# Patient Record
Sex: Male | Born: 1960 | Race: Black or African American | Hispanic: No | Marital: Single | Smoking: Never smoker
Health system: Southern US, Community
[De-identification: ages and names within clinical notes are randomized; demographics above are authoritative.]

## PROBLEM LIST (undated history)

## (undated) DIAGNOSIS — I10 Essential (primary) hypertension: Secondary | ICD-10-CM

## (undated) DIAGNOSIS — E039 Hypothyroidism, unspecified: Secondary | ICD-10-CM

## (undated) DIAGNOSIS — M1990 Unspecified osteoarthritis, unspecified site: Secondary | ICD-10-CM

## (undated) DIAGNOSIS — E119 Type 2 diabetes mellitus without complications: Secondary | ICD-10-CM

## (undated) DIAGNOSIS — M62838 Other muscle spasm: Secondary | ICD-10-CM

## (undated) DIAGNOSIS — E669 Obesity, unspecified: Secondary | ICD-10-CM

## (undated) HISTORY — PX: KNEE SURGERY: SHX244

---

## 2010-10-14 LAB — HM COLONOSCOPY

## 2010-11-01 ENCOUNTER — Ambulatory Visit: Payer: Self-pay | Admitting: Family Medicine

## 2013-12-12 ENCOUNTER — Ambulatory Visit: Payer: Self-pay | Admitting: Gastroenterology

## 2013-12-12 LAB — HM COLONOSCOPY

## 2014-08-22 ENCOUNTER — Other Ambulatory Visit: Payer: Self-pay | Admitting: Family Medicine

## 2014-08-28 ENCOUNTER — Other Ambulatory Visit: Payer: Self-pay | Admitting: Family Medicine

## 2014-08-28 NOTE — Telephone Encounter (Signed)
When was patient last seen and labs done. Thanks.

## 2014-08-28 NOTE — Telephone Encounter (Signed)
Dennis patient 

## 2014-08-29 NOTE — Telephone Encounter (Signed)
Francis Gomez patient 

## 2014-09-25 ENCOUNTER — Telehealth: Payer: Self-pay | Admitting: Family Medicine

## 2014-09-25 NOTE — Telephone Encounter (Signed)
Continues to have arthritis and muscle pains progressing over the past year. 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 He is requesting referral to a rheumatologist. Has had little to no relief from several NSAID's (Relafen, Lodine, Naproxen, Mobic, Ibuprofen). Will schedule referral.

## 2014-09-26 NOTE — Telephone Encounter (Signed)
Information faxed to Dr Wally Kernodle.His office will contact pt °

## 2015-02-12 ENCOUNTER — Telehealth: Payer: Self-pay | Admitting: Family Medicine

## 2015-02-12 NOTE — Telephone Encounter (Signed)
Pt called stating that he has not heard from [facility] Rheumatology department.Notes refaxed.Their office will contact pt

## 2015-02-15 DIAGNOSIS — M222X2 Patellofemoral disorders, left knee: Secondary | ICD-10-CM | POA: Insufficient documentation

## 2015-02-15 DIAGNOSIS — M25562 Pain in left knee: Secondary | ICD-10-CM

## 2015-02-15 DIAGNOSIS — G8929 Other chronic pain: Secondary | ICD-10-CM | POA: Insufficient documentation

## 2015-02-15 DIAGNOSIS — M25561 Pain in right knee: Secondary | ICD-10-CM

## 2015-02-15 DIAGNOSIS — M1712 Unilateral primary osteoarthritis, left knee: Secondary | ICD-10-CM | POA: Insufficient documentation

## 2015-02-16 ENCOUNTER — Other Ambulatory Visit: Payer: Self-pay | Admitting: Internal Medicine

## 2015-02-16 DIAGNOSIS — M222X2 Patellofemoral disorders, left knee: Secondary | ICD-10-CM

## 2015-02-16 DIAGNOSIS — M25562 Pain in left knee: Secondary | ICD-10-CM

## 2015-03-09 ENCOUNTER — Ambulatory Visit
Admission: RE | Admit: 2015-03-09 | Discharge: 2015-03-09 | Disposition: A | Discharge status: Home / Self Care | Payer: PRIVATE HEALTH INSURANCE | Source: Ambulatory Visit | Attending: Internal Medicine | Admitting: Internal Medicine

## 2015-03-09 DIAGNOSIS — M222X2 Patellofemoral disorders, left knee: Secondary | ICD-10-CM

## 2015-03-09 DIAGNOSIS — M1712 Unilateral primary osteoarthritis, left knee: Secondary | ICD-10-CM | POA: Insufficient documentation

## 2015-03-09 DIAGNOSIS — M25562 Pain in left knee: Secondary | ICD-10-CM

## 2015-03-14 ENCOUNTER — Encounter: Payer: Self-pay | Admitting: Family Medicine

## 2015-03-28 ENCOUNTER — Other Ambulatory Visit: Payer: Self-pay | Admitting: Family Medicine

## 2015-03-30 ENCOUNTER — Other Ambulatory Visit: Payer: Self-pay | Admitting: Family Medicine

## 2015-05-14 DIAGNOSIS — I10 Essential (primary) hypertension: Secondary | ICD-10-CM | POA: Insufficient documentation

## 2015-05-14 DIAGNOSIS — R7303 Prediabetes: Secondary | ICD-10-CM | POA: Insufficient documentation

## 2015-05-26 ENCOUNTER — Other Ambulatory Visit: Payer: Self-pay | Admitting: Family Medicine

## 2015-05-30 ENCOUNTER — Other Ambulatory Visit: Payer: Self-pay | Admitting: Family Medicine

## 2015-08-02 ENCOUNTER — Other Ambulatory Visit: Payer: Self-pay | Admitting: Family Medicine

## 2015-08-02 DIAGNOSIS — I10 Essential (primary) hypertension: Secondary | ICD-10-CM

## 2015-08-02 MED ORDER — VERAPAMIL HCL 120 MG PO TABS: 240.0000 mg | ORAL_TABLET | Freq: Every day | ORAL | 3 refills | Status: DC

## 2015-08-17 ENCOUNTER — Ambulatory Visit (INDEPENDENT_AMBULATORY_CARE_PROVIDER_SITE_OTHER): Payer: PRIVATE HEALTH INSURANCE | Admitting: Family Medicine

## 2015-08-17 ENCOUNTER — Encounter: Payer: Self-pay | Admitting: Family Medicine

## 2015-08-17 VITALS — BP 110/70 | HR 76 | Temp 98.1°F | Resp 16 | Ht 70.0 in | Wt 223.0 lb

## 2015-08-17 DIAGNOSIS — L918 Other hypertrophic disorders of the skin: Secondary | ICD-10-CM | POA: Diagnosis not present

## 2015-08-17 NOTE — Progress Notes (Signed)
       Patient: Francis Gomez Male    DOB: 10/23/1960   55 y.o.   MRN: [id number] Visit Date: 08/17/2015  Today's Provider: Dortha Kernennis Chrismon, PA   Chief Complaint  Patient presents with  . Skin Problem   Subjective:    HPI Skin problems: Patient reports lesions for about 6 or 7 years. Patient reports lesions under right and left eye. Patient denies changes to lesions. Patient denies seeing dermatologist or any over the counter treatment.     Patient Active Problem List   Diagnosis Date Noted  . Diabetes mellitus type 2, uncomplicated (HCC) 05/14/2015  . Hypertension 05/14/2015  . Bilateral chronic knee pain 02/15/2015  . Osteoarthritis of left knee 02/15/2015  . Patellofemoral stress syndrome of left knee 02/15/2015   Past Surgical History:  Procedure Laterality Date  . KNEE SURGERY Left    x's 2   Family History  Problem Relation Age of Onset  . Diabetes Mother   . Heart disease Father 3943    MI  . Heart disease Sister    Allergies  Allergen Reactions  . Amlodipine Itching   Current Meds  Medication Sig  . atorvastatin (LIPITOR) 40 MG tablet TAKE 1 TABLET BY MOUTH EVERY DAY  . losartan-hydrochlorothiazide (HYZAAR) 50-12.5 MG tablet TAKE 1 TABLET BY MOUTH EVERY DAY  . verapamil (CALAN) 120 MG tablet Take 2 tablets (240 mg total) by mouth daily.    Review of Systems  Constitutional: Negative.   Skin:       lesions    Social History  Substance Use Topics  . Smoking status: Never Smoker  . Smokeless tobacco: Never Used  . Alcohol use No   Objective:   BP 110/70 (BP Location: Right Arm, Patient Position: Sitting, Cuff Size: Large)   Pulse 76   Temp 98.1 F (36.7 C) (Oral)   Resp 16   Ht 5\' 10"  (1.778 m)   Wt 223 lb (101.2 kg)   BMI 32.00 kg/m   Physical Exam  Constitutional: He is oriented to person, place, and time. He appears well-developed and well-nourished. No distress.  HENT:  Head: Normocephalic and atraumatic.  Right Ear: Hearing normal.    Left Ear: Hearing normal.  Nose: Nose normal.  Eyes: Conjunctivae and lids are normal. Right eye exhibits no discharge. Left eye exhibits no discharge. No scleral icterus.  Pulmonary/Chest: Effort normal. No respiratory distress.  Musculoskeletal: Normal range of motion.  Neurological: He is alert and oriented to person, place, and time.  Skin: Skin is intact. No lesion and no rash noted.  Pedunculated lesion on the right upper eyelid and right upper cheek. Lesions are soft and measure 2 mm and 4 mm respectively.  Psychiatric: He has a normal mood and affect. His speech is normal and behavior is normal. Thought content normal.      Assessment & Plan:     1. Acrochordon Multiple hyperpigmented pedunculated lesions around eyes. Large one below the right eye and one on the upper eyelid becoming uncomfortable over the past month or two. Anesthetized lesions at the base with 1% Xylocaine with Epinephrine and removed/destroyed with Hyfrecator. No post procedure bleeding. May use Spot Band-Aid prn. Recheck prn.       Dortha Kernennis Chrismon, PA  [facility]

## 2015-10-16 LAB — HEMOGLOBIN A1C: Hemoglobin A1C: 6.2

## 2015-11-02 ENCOUNTER — Encounter: Payer: Self-pay | Admitting: Family Medicine

## 2015-12-23 ENCOUNTER — Other Ambulatory Visit: Payer: Self-pay | Admitting: Family Medicine

## 2015-12-24 ENCOUNTER — Other Ambulatory Visit: Payer: Self-pay | Admitting: Family Medicine

## 2015-12-24 DIAGNOSIS — E039 Hypothyroidism, unspecified: Secondary | ICD-10-CM

## 2015-12-24 MED ORDER — LEVOTHYROXINE SODIUM 25 MCG PO TABS: 25.0000 ug | ORAL_TABLET | Freq: Every day | ORAL | 3 refills | Status: DC

## 2015-12-28 ENCOUNTER — Other Ambulatory Visit: Payer: Self-pay | Admitting: Family Medicine

## 2016-02-28 ENCOUNTER — Other Ambulatory Visit: Payer: Self-pay | Admitting: Family Medicine

## 2016-02-28 DIAGNOSIS — M1712 Unilateral primary osteoarthritis, left knee: Secondary | ICD-10-CM

## 2016-02-28 DIAGNOSIS — M25562 Pain in left knee: Principal | ICD-10-CM

## 2016-02-28 DIAGNOSIS — G8929 Other chronic pain: Secondary | ICD-10-CM

## 2016-02-28 DIAGNOSIS — M25561 Pain in right knee: Principal | ICD-10-CM

## 2016-02-28 MED ORDER — PREDNISONE 10 MG PO TABS: 10.0000 mg | ORAL_TABLET | Freq: Every day | ORAL | 0 refills | Status: DC

## 2016-03-25 ENCOUNTER — Other Ambulatory Visit: Payer: Self-pay | Admitting: Family Medicine

## 2016-04-07 ENCOUNTER — Other Ambulatory Visit: Payer: Self-pay | Admitting: Family Medicine

## 2016-04-22 ENCOUNTER — Other Ambulatory Visit: Payer: Self-pay | Admitting: Family Medicine

## 2016-04-22 NOTE — Telephone Encounter (Signed)
Thyroid medication refilled. Remind patient he is due for recheck of labs regarding thyroid and high uric acid levels.

## 2016-04-22 NOTE — Telephone Encounter (Signed)
Patient advised. Patient states he will see Maurine Minister on Thursday at [place].

## 2016-04-24 ENCOUNTER — Other Ambulatory Visit: Payer: Self-pay

## 2016-04-24 ENCOUNTER — Other Ambulatory Visit: Payer: Self-pay | Admitting: Family Medicine

## 2016-04-24 DIAGNOSIS — E790 Hyperuricemia without signs of inflammatory arthritis and tophaceous disease: Secondary | ICD-10-CM

## 2016-04-24 DIAGNOSIS — M25561 Pain in right knee: Principal | ICD-10-CM

## 2016-04-24 DIAGNOSIS — M25562 Pain in left knee: Principal | ICD-10-CM

## 2016-04-24 DIAGNOSIS — R7989 Other specified abnormal findings of blood chemistry: Secondary | ICD-10-CM

## 2016-04-24 DIAGNOSIS — G8929 Other chronic pain: Secondary | ICD-10-CM

## 2016-04-24 MED ORDER — ETODOLAC 200 MG PO CAPS: 200.0000 mg | ORAL_CAPSULE | Freq: Three times a day (TID) | ORAL | 3 refills | Status: DC

## 2016-04-26 LAB — THYROID PANEL
Free Thyroxine Index: 1.8 (ref 1.2–4.9)
T3 Uptake Ratio: 25 % (ref 24–39)
T4, Total: 7.2 ug/dL (ref 4.5–12.0)

## 2016-04-26 LAB — URIC ACID: Uric Acid: 8.2 mg/dL (ref 3.7–8.6)

## 2016-06-09 DIAGNOSIS — M545 Low back pain, unspecified: Secondary | ICD-10-CM | POA: Insufficient documentation

## 2016-06-09 DIAGNOSIS — M79604 Pain in right leg: Secondary | ICD-10-CM | POA: Insufficient documentation

## 2016-06-09 DIAGNOSIS — M79605 Pain in left leg: Secondary | ICD-10-CM | POA: Insufficient documentation

## 2016-06-09 DIAGNOSIS — M5440 Lumbago with sciatica, unspecified side: Secondary | ICD-10-CM | POA: Insufficient documentation

## 2016-07-21 ENCOUNTER — Other Ambulatory Visit: Payer: Self-pay | Admitting: Family Medicine

## 2016-07-21 DIAGNOSIS — I10 Essential (primary) hypertension: Secondary | ICD-10-CM

## 2016-08-24 ENCOUNTER — Other Ambulatory Visit: Payer: Self-pay | Admitting: Family Medicine

## 2016-08-27 ENCOUNTER — Other Ambulatory Visit: Payer: Self-pay | Admitting: Family Medicine

## 2016-08-27 NOTE — Telephone Encounter (Signed)
Francis Gomez's patient. Last OV 08/17/15 Last RF 04/07/16

## 2016-08-29 ENCOUNTER — Other Ambulatory Visit: Payer: Self-pay | Admitting: Family Medicine

## 2016-08-30 ENCOUNTER — Other Ambulatory Visit: Payer: Self-pay | Admitting: Family Medicine

## 2016-09-23 ENCOUNTER — Other Ambulatory Visit: Payer: Self-pay | Admitting: Family Medicine

## 2016-12-17 ENCOUNTER — Other Ambulatory Visit: Payer: Self-pay | Admitting: Family Medicine

## 2017-01-20 ENCOUNTER — Encounter: Payer: Self-pay | Admitting: Family Medicine

## 2017-01-20 ENCOUNTER — Ambulatory Visit (INDEPENDENT_AMBULATORY_CARE_PROVIDER_SITE_OTHER): Payer: Self-pay | Admitting: Family Medicine

## 2017-01-20 VITALS — BP 148/88 | HR 91 | Temp 97.5°F | Ht 70.0 in | Wt 231.4 lb

## 2017-01-20 DIAGNOSIS — Z1159 Encounter for screening for other viral diseases: Secondary | ICD-10-CM

## 2017-01-20 DIAGNOSIS — E119 Type 2 diabetes mellitus without complications: Secondary | ICD-10-CM

## 2017-01-20 DIAGNOSIS — E039 Hypothyroidism, unspecified: Secondary | ICD-10-CM

## 2017-01-20 DIAGNOSIS — M159 Polyosteoarthritis, unspecified: Secondary | ICD-10-CM

## 2017-01-20 DIAGNOSIS — Z0000 Encounter for general adult medical examination without abnormal findings: Secondary | ICD-10-CM

## 2017-01-20 DIAGNOSIS — M2240 Chondromalacia patellae, unspecified knee: Secondary | ICD-10-CM | POA: Insufficient documentation

## 2017-01-20 DIAGNOSIS — Z125 Encounter for screening for malignant neoplasm of prostate: Secondary | ICD-10-CM

## 2017-01-20 DIAGNOSIS — I10 Essential (primary) hypertension: Secondary | ICD-10-CM

## 2017-01-20 DIAGNOSIS — Z114 Encounter for screening for human immunodeficiency virus [HIV]: Secondary | ICD-10-CM

## 2017-01-20 DIAGNOSIS — E782 Mixed hyperlipidemia: Secondary | ICD-10-CM

## 2017-01-20 LAB — POCT UA - MICROALBUMIN: Microalbumin Ur, POC: 20 mg/L

## 2017-01-20 NOTE — Progress Notes (Signed)
Patient: Francis Gomez, Male    DOB: [birth year], 57 y.o.   MRN: [id number] Visit Date: 01/20/2017  Today's Provider: Dortha Kern, PA   Chief Complaint  Patient presents with  . Annual Exam   Subjective:    Annual physical exam Francis Gomez is a 57 y.o. male who presents today for health maintenance and complete physical. He feels poorly due to arthritis.  He reports exercising none. He reports he is sleeping fairly well.  -----------------------------------------------------------------   Review of Systems  Constitutional: Negative.   HENT: Negative.   Eyes: Positive for redness.  Respiratory: Positive for cough.   Cardiovascular: Negative.   Gastrointestinal: Negative.   Endocrine: Negative.   Genitourinary: Negative.   Musculoskeletal: Positive for arthralgias and back pain.  Skin: Negative.   Allergic/Immunologic: Negative.   Neurological: Negative.   Hematological: Negative.   Psychiatric/Behavioral: Negative.     Social History      He  reports that  has never smoked. he has never used smokeless tobacco. He reports that he does not drink alcohol or use drugs.       Social History   Socioeconomic History  . Marital status: Single    Spouse name: None  . Number of children: None  . Years of education: None  . Highest education level: None  Social Needs  . Financial resource strain: None  . Food insecurity - worry: None  . Food insecurity - inability: None  . Transportation needs - medical: None  . Transportation needs - non-medical: None  Occupational History  . None  Tobacco Use  . Smoking status: Never Smoker  . Smokeless tobacco: Never Used  Substance and Sexual Activity  . Alcohol use: No  . Drug use: No  . Sexual activity: None  Other Topics Concern  . None  Social History Narrative  . None   Patient Active Problem List   Diagnosis Date Noted  . 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 Lumbago with sciatica 06/09/2016  .  Diabetes mellitus type 2, uncomplicated (HCC) 05/14/2015  . Hypertension 05/14/2015  . Bilateral chronic knee pain 02/15/2015  . Osteoarthritis of left knee 02/15/2015  . Patellofemoral stress syndrome of left knee 02/15/2015   Past Surgical History:  Procedure Laterality Date  . KNEE SURGERY Left    x's 2   Family History        Family Status  Relation Name Status  . Mother  Alive  . Father  Deceased  . Sister  Alive  . Brother  Alive  . Sister  Alive  . Sister  Alive  . Brother  Alive        His family history includes Diabetes in his mother; Heart disease in his sister; Heart disease (age of onset: 61) in his father.     Allergies  Allergen Reactions  . Amlodipine Itching    Current Outpatient Medications:  .  atorvastatin (LIPITOR) 40 MG tablet, TAKE 1 TABLET BY MOUTH EVERY DAY, Disp: 30 tablet, Rfl: 5 .  etodolac (LODINE) 200 MG capsule, Take 1 capsule (200 mg total) by mouth every 8 (eight) hours., Disp: 40 capsule, Rfl: 3 .  levothyroxine (SYNTHROID, LEVOTHROID) 25 MCG tablet, TAKE 1 TABLET (25 MCG TOTAL) BY MOUTH DAILY-- BEFORE BREAKFAST., Disp: 30 tablet, Rfl: 3 .  losartan-hydrochlorothiazide (HYZAAR) 50-12.5 MG tablet, TAKE 1 TABLET BY MOUTH EVERY DAY, Disp: 90 tablet, Rfl: 1 .  verapamil (CALAN) 120 MG tablet, TAKE 2 TABLETS (240 MG TOTAL) BY MOUTH  DAILY, Disp: 180 tablet, Rfl: 3   Patient Care Team: Chrismon, Jodell Ciproennis E, PA as PCP - General (Family Medicine)      Objective:   Vitals: BP (!) 148/88 (BP Location: Right Arm, Patient Position: Sitting, Cuff Size: Normal)   Pulse 91   Temp (!) 97.5 F (36.4 C) (Oral)   Ht 5\' 10"  (1.778 m)   Wt 231 lb 6.4 oz (105 kg)   SpO2 98%   BMI 33.20 kg/m     Physical Exam  Constitutional: He is oriented to person, place, and time. He appears well-developed and well-nourished.  HENT:  Head: Normocephalic and atraumatic.  Right Ear: External ear normal.  Left Ear: External ear normal.  Nose: Nose normal.    Mouth/Throat: Oropharynx is clear and moist.  Eyes: Conjunctivae and EOM are normal. Pupils are equal, round, and reactive to light. Right eye exhibits no discharge.  Neck: Normal range of motion. Neck supple. No tracheal deviation present. No thyromegaly present.  Cardiovascular: Normal rate, regular rhythm, normal heart sounds and intact distal pulses.  No murmur heard. Pulmonary/Chest: Effort normal and breath sounds normal. No respiratory distress. He has no wheezes. He has no rales. He exhibits no tenderness.  Abdominal: Soft. He exhibits no distension and no mass. There is no tenderness. There is no rebound and no guarding.  Genitourinary: Rectum normal and prostate normal. Rectal exam shows guaiac negative stool.  Musculoskeletal: He exhibits tenderness. He exhibits no edema or deformity.  Tender to palpate around ankles and knees. Increased pain to test knee flexion to 120 degrees. No locking or crepitus. Some sharp pains to test collateral ligaments. No swelling or redness of joints. Good range of motion throughout.   Lymphadenopathy:    He has no cervical adenopathy.  Neurological: He is alert and oriented to person, place, and time. He has normal reflexes. No cranial nerve deficit. He exhibits normal muscle tone. Coordination normal.  Skin: Skin is warm and dry. No rash noted. No erythema.  Psychiatric: He has a normal mood and affect. His behavior is normal. Judgment and thought content normal.     Depression Screen PHQ 2/9 Scores 01/20/2017  PHQ - 2 Score 5  PHQ- 9 Score 15    Assessment & Plan:     Routine Health Maintenance and Physical Exam  Exercise Activities and Dietary recommendations Goals    Unable to exercise significantly due to arthritis limiting mobility. Recommend low fat diet.      Immunization History  Administered Date(s) Administered  . Influenza-Unspecified 10/06/2016    Health Maintenance  Topic Date Due  . Hepatitis C Screening  Jul 09, 1960   . PNEUMOCOCCAL POLYSACCHARIDE VACCINE (1) 07/26/1962  . FOOT EXAM  07/26/1970  . OPHTHALMOLOGY EXAM  07/26/1970  . HIV Screening  07/26/1975  . TETANUS/TDAP  07/26/1979  . COLONOSCOPY  07/26/2010  . HEMOGLOBIN A1C  04/15/2016  . INFLUENZA VACCINE  Completed     Discussed health benefits of physical activity, and encouraged him to engage in regular exercise appropriate for his age and condition.    -------------------------------------------------------------------- 1. Annual physical exam General health stable. Continues to have knee and ankle pains from arthritis that limits all activities. He is in the process of applying for disability. States he is unable to stand to work due to this pain issue.  2. Type 2 diabetes mellitus without complication, unspecified whether long term insulin use (HCC) hgb A1C was 6.2% on 10-16-15 - prediabetes level. Denies polyuria, polydipsia or polyphagia. No hypoglycemic  events. Will the labs for progression from prediabetes to full diabetes and other metabolic disorders. - Lipid Profile - HgB A1c - HIV antibody - POCT UA - Microalbumin - CBC with Differential/Platelet - Comprehensive metabolic panel  3. Essential hypertension Tolerating Verapamil 240 mg qd and Hyzaar 50-12.5 mg qd. Should get back on medication regularly and restrict caffeine, salt, ETOH, etc. Check routine labs and follow up pending report. - Lipid Profile - HIV antibody - CBC with Differential/Platelet - Comprehensive metabolic panel  4. Hypothyroidism, unspecified type Still taking Levothyroxine 25 mcg qd. Recheck labs. Denies edema, palpitations, sweats or tremors.  - CBC with Differential/Platelet - T4 - TSH  5. Need for hepatitis C screening test - Hepatitis C Antibody  6. Screening for HIV (human immunodeficiency virus) - HIV antibody  7. Prostate cancer screening - PSA  8. Mixed hyperlipidemia Tolerating Atorvastatin 40 mg qd. Must continue low fat diet and  increase in water intake. Recheck lipid panel with CMP. - Lipid Profile - Comprehensive metabolic panel  9. Osteoarthritis of multiple joints, unspecified osteoarthritis type Many years of arthralgias - left knee osteoarthritis, patellofemoral syndrome, chondromalacia and arthroscopic surgery in 2006 and 2007. He has failed multiple NSAID's, steroid injections, knee brace and cannot tolerate Oxycontin due to severe constipation. Has had orthopedic and rheumatology evaluations. Has had a fall at Christmas 2018 when the left knee "gave away". May continue Tylenol prn pain and/or apply Aspercreme with Lidocaine. Requests letter stating these facts in his attempt to apply for disability.    Dortha Kern, PA  [facility]

## 2017-01-21 LAB — CBC WITH DIFFERENTIAL/PLATELET
Basophils Absolute: 0 10*3/uL (ref 0.0–0.2)
Basos: 0 %
EOS (ABSOLUTE): 0.2 10*3/uL (ref 0.0–0.4)
EOS: 4 %
HEMATOCRIT: 45.4 % (ref 37.5–51.0)
HEMOGLOBIN: 15.7 g/dL (ref 13.0–17.7)
IMMATURE GRANULOCYTES: 0 %
Immature Grans (Abs): 0 10*3/uL (ref 0.0–0.1)
LYMPHS ABS: 2.1 10*3/uL (ref 0.7–3.1)
Lymphs: 45 %
MCH: 27.8 pg (ref 26.6–33.0)
MCHC: 34.6 g/dL (ref 31.5–35.7)
MCV: 80 fL (ref 79–97)
MONOCYTES: 5 %
Monocytes Absolute: 0.2 10*3/uL (ref 0.1–0.9)
NEUTROS PCT: 46 %
Neutrophils Absolute: 2.1 10*3/uL (ref 1.4–7.0)
Platelets: 239 10*3/uL (ref 150–379)
RBC: 5.65 x10E6/uL (ref 4.14–5.80)
RDW: 15 % (ref 12.3–15.4)
WBC: 4.7 10*3/uL (ref 3.4–10.8)

## 2017-01-21 LAB — COMPREHENSIVE METABOLIC PANEL
ALK PHOS: 131 IU/L — AB (ref 39–117)
ALT: 36 IU/L (ref 0–44)
AST: 24 IU/L (ref 0–40)
Albumin/Globulin Ratio: 1.3 (ref 1.2–2.2)
Albumin: 4.3 g/dL (ref 3.5–5.5)
BUN/Creatinine Ratio: 7 — ABNORMAL LOW (ref 9–20)
BUN: 8 mg/dL (ref 6–24)
Bilirubin Total: 0.4 mg/dL (ref 0.0–1.2)
CALCIUM: 9.7 mg/dL (ref 8.7–10.2)
CO2: 22 mmol/L (ref 20–29)
CREATININE: 1.21 mg/dL (ref 0.76–1.27)
Chloride: 103 mmol/L (ref 96–106)
GFR calc Af Amer: 77 mL/min/{1.73_m2} (ref >59)
GFR, EST NON AFRICAN AMERICAN: 67 mL/min/{1.73_m2} (ref >59)
GLUCOSE: 97 mg/dL (ref 65–99)
Globulin, Total: 3.4 g/dL (ref 1.5–4.5)
Potassium: 3.9 mmol/L (ref 3.5–5.2)
SODIUM: 141 mmol/L (ref 134–144)
Total Protein: 7.7 g/dL (ref 6.0–8.5)

## 2017-01-21 LAB — T4: T4 TOTAL: 7.3 ug/dL (ref 4.5–12.0)

## 2017-01-21 LAB — TSH: TSH: 2.85 u[IU]/mL (ref 0.450–4.500)

## 2017-01-21 LAB — LIPID PANEL
Chol/HDL Ratio: 5.3 ratio — ABNORMAL HIGH (ref 0.0–5.0)
Cholesterol, Total: 176 mg/dL (ref 100–199)
HDL: 33 mg/dL — ABNORMAL LOW (ref >39)
Triglycerides: 403 mg/dL — ABNORMAL HIGH (ref 0–149)

## 2017-01-21 LAB — HEPATITIS C ANTIBODY: Hep C Virus Ab: <0.1 s/co ratio (ref 0.0–0.9)

## 2017-01-21 LAB — HEMOGLOBIN A1C
Est. average glucose Bld gHb Est-mCnc: 137 mg/dL
HEMOGLOBIN A1C: 6.4 % — AB (ref 4.8–5.6)

## 2017-01-21 LAB — HIV ANTIBODY (ROUTINE TESTING W REFLEX): HIV Screen 4th Generation wRfx: NONREACTIVE

## 2017-01-21 LAB — PSA: PROSTATE SPECIFIC AG, SERUM: 0.7 ng/mL (ref 0.0–4.0)

## 2017-01-26 ENCOUNTER — Encounter: Payer: Self-pay | Admitting: Family Medicine

## 2017-01-27 ENCOUNTER — Other Ambulatory Visit: Payer: Self-pay | Admitting: Family Medicine

## 2017-01-27 DIAGNOSIS — M1712 Unilateral primary osteoarthritis, left knee: Secondary | ICD-10-CM

## 2017-01-27 DIAGNOSIS — M25562 Pain in left knee: Secondary | ICD-10-CM

## 2017-01-27 DIAGNOSIS — G8929 Other chronic pain: Secondary | ICD-10-CM

## 2017-01-27 NOTE — Progress Notes (Signed)
Advised 

## 2017-01-27 NOTE — Progress Notes (Signed)
Advised  ED 

## 2017-02-12 ENCOUNTER — Other Ambulatory Visit: Payer: Self-pay | Admitting: Physician Assistant

## 2017-03-24 ENCOUNTER — Other Ambulatory Visit: Payer: Self-pay | Admitting: Family Medicine

## 2017-03-24 DIAGNOSIS — I10 Essential (primary) hypertension: Secondary | ICD-10-CM

## 2017-03-24 MED ORDER — LOSARTAN POTASSIUM 50 MG PO TABS: 50.0000 mg | ORAL_TABLET | Freq: Every day | ORAL | 3 refills | Status: DC

## 2017-03-24 MED ORDER — HYDROCHLOROTHIAZIDE 12.5 MG PO TABS: 12.5000 mg | ORAL_TABLET | Freq: Every day | ORAL | 3 refills | Status: DC

## 2017-03-24 NOTE — Progress Notes (Signed)
Hyzaar on back order and not available. Pharmacy has Losartan and HCTZ in separate pills. Will send these two prescriptions to continue treatment of hypertension.

## 2017-04-15 ENCOUNTER — Other Ambulatory Visit: Payer: Self-pay | Admitting: Family Medicine

## 2017-05-26 ENCOUNTER — Other Ambulatory Visit: Payer: Self-pay | Admitting: Family Medicine

## 2017-05-26 DIAGNOSIS — I10 Essential (primary) hypertension: Secondary | ICD-10-CM

## 2017-06-23 ENCOUNTER — Other Ambulatory Visit: Payer: Self-pay | Admitting: Family Medicine

## 2017-07-01 ENCOUNTER — Other Ambulatory Visit: Payer: Self-pay | Admitting: Family Medicine

## 2017-07-01 DIAGNOSIS — I10 Essential (primary) hypertension: Secondary | ICD-10-CM

## 2017-07-01 NOTE — Telephone Encounter (Signed)
[facility] pharmacy faxed a refill request for the following medication. Thanks CC  losartan (COZAAR) 50 MG tablet

## 2017-07-02 MED ORDER — LOSARTAN POTASSIUM 50 MG PO TABS: 50.0000 mg | ORAL_TABLET | Freq: Every day | ORAL | 3 refills | Status: DC

## 2017-08-26 ENCOUNTER — Other Ambulatory Visit: Payer: Self-pay | Admitting: Family Medicine

## 2017-10-30 ENCOUNTER — Other Ambulatory Visit: Payer: Self-pay | Admitting: Family Medicine

## 2017-11-19 ENCOUNTER — Other Ambulatory Visit: Payer: Self-pay | Admitting: Family Medicine

## 2017-11-19 DIAGNOSIS — I10 Essential (primary) hypertension: Secondary | ICD-10-CM

## 2017-11-26 ENCOUNTER — Other Ambulatory Visit: Payer: Self-pay | Admitting: Family Medicine

## 2017-12-25 ENCOUNTER — Other Ambulatory Visit: Payer: Self-pay | Admitting: Family Medicine

## 2018-02-04 ENCOUNTER — Other Ambulatory Visit: Payer: Self-pay | Admitting: Family Medicine

## 2018-03-10 NOTE — Progress Notes (Deleted)
Patient: Francis Gomez, Male    DOB: [birth year], 58 y.o.   MRN: [id number] Visit Date: 03/10/2018  Today's Provider: Dortha Kern, PA   No chief complaint on file.  Subjective:     Annual physical exam Francis Gomez is a 58 y.o. male who presents today for health maintenance and complete physical. He feels {DESC; WELL/FAIRLY WELL/POORLY:18703}. He reports exercising ***. He reports he is sleeping {DESC; WELL/FAIRLY WELL/POORLY:18703}.  -----------------------------------------------------------------   Review of Systems  Social History      He  reports that he has never smoked. He has never used smokeless tobacco. He reports that he does not drink alcohol or use drugs.       Social History   Socioeconomic History  . Marital status: Single    Spouse name: Not on file  . Number of children: Not on file  . Years of education: Not on file  . Highest education level: Not on file  Occupational History  . Not on file  Social Needs  . Financial resource strain: Not on file  . Food insecurity:    Worry: Not on file    Inability: Not on file  . Transportation needs:    Medical: Not on file    Non-medical: Not on file  Tobacco Use  . Smoking status: Never Smoker  . Smokeless tobacco: Never Used  Substance and Sexual Activity  . Alcohol use: No  . Drug use: No  . Sexual activity: Not on file  Lifestyle  . Physical activity:    Days per week: Not on file    Minutes per session: Not on file  . Stress: Not on file  Relationships  . Social connections:    Talks on phone: Not on file    Gets together: Not on file    Attends religious service: Not on file    Active member of club or organization: Not on file    Attends meetings of clubs or organizations: Not on file    Relationship status: Not on file  Other Topics Concern  . Not on file  Social History Narrative  . Not on file    No past medical history on file.   Patient Active Problem List   Diagnosis Date Noted  . 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 Hypothyroidism 01/20/2017  . Lumbago with sciatica 06/09/2016  . Diabetes mellitus type 2, uncomplicated (HCC) 05/14/2015  . Hypertension 05/14/2015  . Bilateral chronic knee pain 02/15/2015  . Osteoarthritis of left knee 02/15/2015  . Patellofemoral stress syndrome of left knee 02/15/2015    Past Surgical History:  Procedure Laterality Date  . KNEE SURGERY Left    x's 2    Family History        Family Status  Relation Name Status  . Mother  Alive  . Father  Deceased  . Sister  Alive  . Brother  Alive  . Sister  Alive  . Sister  Alive  . Brother  Alive        His family history includes Diabetes in his mother; Heart disease in his sister; Heart disease (age of onset: 85) in his father.      Allergies  Allergen Reactions  . Amlodipine Itching     Current Outpatient Medications:  .  atorvastatin (LIPITOR) 40 MG tablet, TAKE 1 TABLET BY MOUTH EVERY DAY, Disp: 30 tablet, Rfl: 1 .  etodolac (LODINE) 200 MG capsule, Take 1 capsule (200 mg total) by mouth every  8 (eight) hours., Disp: 40 capsule, Rfl: 3 .  hydrochlorothiazide (HYDRODIURIL) 12.5 MG tablet, Take 1 tablet (12.5 mg total) by mouth daily., Disp: 90 tablet, Rfl: 3 .  levothyroxine (SYNTHROID, LEVOTHROID) 25 MCG tablet, TAKE 1 TABLET (25 MCG TOTAL) BY MOUTH DAILY-- BEFORE BREAKFAST., Disp: 90 tablet, Rfl: 3 .  losartan (COZAAR) 50 MG tablet, Take 1 tablet (50 mg total) by mouth daily., Disp: 90 tablet, Rfl: 3 .  verapamil (CALAN) 120 MG tablet, TAKE 2 TABLETS BY MOUTH EVERY DAY, Disp: 180 tablet, Rfl: 1   Patient Care Team: Chrismon, Jodell Cipro, PA as PCP - General (Family Medicine)    Objective:    Vitals: There were no vitals taken for this visit.  There were no vitals filed for this visit.   Physical Exam   Depression Screen PHQ 2/9 Scores 01/20/2017  PHQ - 2 Score 5  PHQ- 9 Score 15       Assessment & Plan:     Routine Health  Maintenance and Physical Exam  Exercise Activities and Dietary recommendations Goals   None     Immunization History  Administered Date(s) Administered  . Influenza-Unspecified 10/06/2016    Health Maintenance  Topic Date Due  . PNEUMOCOCCAL POLYSACCHARIDE VACCINE AGE 49-64 HIGH RISK  07/26/1962  . FOOT EXAM  07/26/1970  . OPHTHALMOLOGY EXAM  07/26/1970  . TETANUS/TDAP  07/26/1979  . HEMOGLOBIN A1C  07/20/2017  . INFLUENZA VACCINE  08/06/2017  . COLONOSCOPY  12/13/2018  . Hepatitis C Screening  Completed  . HIV Screening  Completed     Discussed health benefits of physical activity, and encouraged him to engage in regular exercise appropriate for his age and condition.    --------------------------------------------------------------------    Dortha Kern, PA  [facility]

## 2018-03-11 ENCOUNTER — Encounter: Payer: Self-pay | Admitting: Family Medicine

## 2018-03-11 ENCOUNTER — Other Ambulatory Visit: Payer: Self-pay | Admitting: Family Medicine

## 2018-03-23 NOTE — Progress Notes (Signed)
Patient: Francis Gomez, Male    DOB: [birth year], 58 y.o.   MRN: [id number] Visit Date: 03/26/2018  Today's Provider: Dortha Kernennis Missy Baksh, PA   Chief Complaint  Patient presents with  . Annual Exam   Subjective:     Annual physical exam Francis Gomez is a 58 y.o. male who presents today for health maintenance and complete physical. He feels fairly well. He reports exercising some. He reports he is sleeping fairly well.  -----------------------------------------------------------------   Review of Systems  Eyes: Positive for itching.  Musculoskeletal: Positive for arthralgias.  All other systems reviewed and are negative.  Social History      He  reports that he has never smoked. He has never used smokeless tobacco. He reports that he does not drink alcohol or use drugs.       Social History   Socioeconomic History  . Marital status: Single    Spouse name: Not on file  . Number of children: Not on file  . Years of education: Not on file  . Highest education level: Not on file  Occupational History  . Not on file  Social Needs  . Financial resource strain: Not on file  . Food insecurity:    Worry: Not on file    Inability: Not on file  . Transportation needs:    Medical: Not on file    Non-medical: Not on file  Tobacco Use  . Smoking status: Never Smoker  . Smokeless tobacco: Never Used  Substance and Sexual Activity  . Alcohol use: No  . Drug use: No  . Sexual activity: Not on file  Lifestyle  . Physical activity:    Days per week: Not on file    Minutes per session: Not on file  . Stress: Not on file  Relationships  . Social connections:    Talks on phone: Not on file    Gets together: Not on file    Attends religious service: Not on file    Active member of club or organization: Not on file    Attends meetings of clubs or organizations: Not on file    Relationship status: Not on file  Other Topics Concern  . Not on file  Social History  Narrative  . Not on file   History reviewed. No pertinent past medical history.  Patient Active Problem List   Diagnosis Date Noted  . 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 Hypothyroidism 01/20/2017  . Lumbago with sciatica 06/09/2016  . Prediabetes 05/14/2015  . Hypertension 05/14/2015  . Bilateral chronic knee pain 02/15/2015  . Osteoarthritis of left knee 02/15/2015  . Patellofemoral stress syndrome of left knee 02/15/2015   Past Surgical History:  Procedure Laterality Date  . KNEE SURGERY Left    x's 2   Family History        Family Status  Relation Name Status  . Mother  Alive  . Father  Deceased  . Sister  Alive  . Brother  Alive  . Sister  Alive  . Sister  Alive  . Brother  Alive        His family history includes Diabetes in his mother; Heart disease in his sister; Heart disease (age of onset: 7443) in his father.     Allergies  Allergen Reactions  . Amlodipine Itching    Current Outpatient Medications:  .  atorvastatin (LIPITOR) 40 MG tablet, TAKE 1 TABLET BY MOUTH EVERY DAY, Disp: 30 tablet, Rfl: 1 .  levothyroxine (  SYNTHROID, LEVOTHROID) 25 MCG tablet, TAKE 1 TABLET (25 MCG TOTAL) BY MOUTH DAILY-- BEFORE BREAKFAST., Disp: 90 tablet, Rfl: 3 .  losartan (COZAAR) 50 MG tablet, Take 1 tablet (50 mg total) by mouth daily., Disp: 90 tablet, Rfl: 3 .  verapamil (CALAN) 120 MG tablet, TAKE 2 TABLETS BY MOUTH EVERY DAY, Disp: 180 tablet, Rfl: 1 .  etodolac (LODINE) 200 MG capsule, Take 1 capsule (200 mg total) by mouth every 8 (eight) hours. (Patient not taking: Reported on 03/26/2018), Disp: 40 capsule, Rfl: 3 .  hydrochlorothiazide (HYDRODIURIL) 12.5 MG tablet, Take 1 tablet (12.5 mg total) by mouth daily. (Patient not taking: Reported on 03/26/2018), Disp: 90 tablet, Rfl: 3   Patient Care Team: Valynn Schamberger, Jodell Cipro, PA as PCP - General (Family Medicine)   Objective:    Vitals: BP 110/70 (BP Location: Right Arm, Patient Position: Sitting, Cuff Size: Large)   Pulse  88   Temp 98 F (36.7 C) (Oral)   Resp 16   Ht 5\' 10"  (1.778 m)   Wt 230 lb (104.3 kg)   SpO2 94%   BMI 33.00 kg/m    Wt Readings from Last 3 Encounters:  03/26/18 230 lb (104.3 kg)  01/20/17 231 lb 6.4 oz (105 kg)  08/17/15 223 lb (101.2 kg)   Vitals:   03/26/18 1016  BP: 110/70  Pulse: 88  Resp: 16  Temp: 98 F (36.7 C)  TempSrc: Oral  SpO2: 94%  Weight: 230 lb (104.3 kg)  Height: 5\' 10"  (1.778 m)    Physical Exam Constitutional:      Appearance: He is well-developed.  HENT:     Head: Normocephalic and atraumatic.     Right Ear: External ear normal.     Left Ear: External ear normal.     Nose: Nose normal.  Eyes:     General:        Right eye: No discharge.     Conjunctiva/sclera: Conjunctivae normal.     Pupils: Pupils are equal, round, and reactive to light.  Neck:     Musculoskeletal: Normal range of motion and neck supple.     Thyroid: No thyromegaly.     Trachea: No tracheal deviation.  Cardiovascular:     Rate and Rhythm: Normal rate and regular rhythm.     Heart sounds: Normal heart sounds. No murmur.  Pulmonary:     Effort: Pulmonary effort is normal. No respiratory distress.     Breath sounds: Normal breath sounds. No wheezing or rales.  Chest:     Chest wall: No tenderness.  Abdominal:     General: There is no distension.     Palpations: Abdomen is soft. There is no mass.     Tenderness: There is no abdominal tenderness. There is no guarding or rebound.  Genitourinary:    Penis: Normal.      Scrotum/Testes: Normal.     Prostate: Normal.     Rectum: Normal. Guaiac result negative.  Musculoskeletal: Normal range of motion.        General: No tenderness.  Lymphadenopathy:     Cervical: No cervical adenopathy.  Skin:    General: Skin is warm and dry.     Findings: No erythema or rash.  Neurological:     Mental Status: He is alert and oriented to person, place, and time.     Cranial Nerves: No cranial nerve deficit.     Motor: No abnormal  muscle tone.     Coordination: Coordination normal.  Deep Tendon Reflexes: Reflexes are normal and symmetric. Reflexes normal.  Psychiatric:        Behavior: Behavior normal.        Thought Content: Thought content normal.        Judgment: Judgment normal.      Depression Screen PHQ 2/9 Scores 03/26/2018 01/20/2017  PHQ - 2 Score 3 5  PHQ- 9 Score 6 15     Assessment & Plan:     Routine Health Maintenance and Physical Exam  Exercise Activities and Dietary recommendations Goals   Encouraged 6 eight ounce glasses of water daily with low fat diet and should exercise (walk) 30-40 minutes 3-4 days a week.    Immunization History  Administered Date(s) Administered  . Influenza-Unspecified 10/06/2016   Health Maintenance  Topic Date Due  . PNEUMOCOCCAL POLYSACCHARIDE VACCINE AGE 49-64 HIGH RISK  07/26/1962  . FOOT EXAM  07/26/1970  . OPHTHALMOLOGY EXAM  07/26/1970  . TETANUS/TDAP  07/26/1979  . HEMOGLOBIN A1C  07/20/2017  . INFLUENZA VACCINE  08/06/2017  . COLONOSCOPY  12/13/2018  . Hepatitis C Screening  Completed  . HIV Screening  Completed    Discussed health benefits of physical activity, and encouraged him to engage in regular exercise appropriate for his age and condition.    -------------------------------------------------------------------- 1. Prediabetes Hgb A1C was 6.4% last year and weight is nearly the same with BMI over 33. No polyuria, polydipsia or polyphagia. Denies vision changes. Will recheck labs for evidence of progression to full diabetes. Microalbumin level same as last year at 20 mg/L and still taking ARB. Should continue low fat diet and encouraged exercise to reduce weight for sugar control. General exam normal and up dated immunizations. Last colonoscopy normal in 2015. - POCT UA - Microalbumin - CBC with Differential/Platelet - Comprehensive metabolic panel - Lipid panel - Hemoglobin A1c  2. Essential hypertension Well controlled. Continues  to take Losartan 50 mg qd, Verapamil 120 mg 2 tablets daily and HCTZ 12.5 mg qd. Recheck routine labs and follow up pending reports. - CBC with Differential/Platelet - Comprehensive metabolic panel - TSH  3. Hypothyroidism, unspecified type Tolerating Levothyroxine 25 mcg qd. Questionable exophthalmos but no tremor, palpitations or heat/cold intolerance. Will recheck labs and consider need for dosage adjustment with BMI over 33. - CBC with Differential/Platelet - TSH - T4  4. BMI 33.0-33.9,adult No significant weight gain in the past year but BMI still over 33. Recommend he reduce caloric intake and walk for exercise 30-40 minutes 3-4 days a week. Recheck routine labs to assess metabolic condition. - CBC with Differential/Platelet - Comprehensive metabolic panel - Lipid panel - Hemoglobin A1c - TSH - T4  5. Need for influenza vaccination - Flu Vaccine QUAD 36+ mos IM  6. Need for Tdap vaccination - Tdap vaccine greater than or equal to 7yo IM    Dortha Kern, PA  [facility]

## 2018-03-26 ENCOUNTER — Encounter: Payer: Self-pay | Admitting: Family Medicine

## 2018-03-26 ENCOUNTER — Other Ambulatory Visit: Payer: Self-pay

## 2018-03-26 ENCOUNTER — Ambulatory Visit: Payer: Self-pay | Admitting: Family Medicine

## 2018-03-26 VITALS — BP 110/70 | HR 88 | Temp 98.0°F | Resp 16 | Ht 70.0 in | Wt 230.0 lb

## 2018-03-26 DIAGNOSIS — Z23 Encounter for immunization: Secondary | ICD-10-CM

## 2018-03-26 DIAGNOSIS — R7303 Prediabetes: Secondary | ICD-10-CM

## 2018-03-26 DIAGNOSIS — Z6833 Body mass index (BMI) 33.0-33.9, adult: Secondary | ICD-10-CM

## 2018-03-26 DIAGNOSIS — I10 Essential (primary) hypertension: Secondary | ICD-10-CM

## 2018-03-26 DIAGNOSIS — E039 Hypothyroidism, unspecified: Secondary | ICD-10-CM

## 2018-03-26 LAB — POCT UA - MICROALBUMIN: Microalbumin Ur, POC: 20 mg/L

## 2018-03-26 MED ORDER — HYDROCHLOROTHIAZIDE 12.5 MG PO TABS: 12.5000 mg | ORAL_TABLET | Freq: Every day | ORAL | 3 refills | Status: DC

## 2018-03-27 LAB — HEMOGLOBIN A1C
Est. average glucose Bld gHb Est-mCnc: 137 mg/dL
Hgb A1c MFr Bld: 6.4 % — ABNORMAL HIGH (ref 4.8–5.6)

## 2018-03-27 LAB — COMPREHENSIVE METABOLIC PANEL
ALT: 46 IU/L — ABNORMAL HIGH (ref 0–44)
AST: 29 IU/L (ref 0–40)
Albumin/Globulin Ratio: 1.4 (ref 1.2–2.2)
Albumin: 4.5 g/dL (ref 3.8–4.9)
Alkaline Phosphatase: 160 IU/L — ABNORMAL HIGH (ref 39–117)
BILIRUBIN TOTAL: 0.5 mg/dL (ref 0.0–1.2)
BUN/Creatinine Ratio: 9 (ref 9–20)
BUN: 11 mg/dL (ref 6–24)
CO2: 22 mmol/L (ref 20–29)
Calcium: 9.3 mg/dL (ref 8.7–10.2)
Chloride: 102 mmol/L (ref 96–106)
Creatinine, Ser: 1.22 mg/dL (ref 0.76–1.27)
GFR calc Af Amer: 76 mL/min/{1.73_m2} (ref >59)
GFR calc non Af Amer: 65 mL/min/{1.73_m2} (ref >59)
Globulin, Total: 3.2 g/dL (ref 1.5–4.5)
Glucose: 80 mg/dL (ref 65–99)
Potassium: 4.2 mmol/L (ref 3.5–5.2)
Sodium: 142 mmol/L (ref 134–144)
Total Protein: 7.7 g/dL (ref 6.0–8.5)

## 2018-03-27 LAB — TSH: TSH: 2.89 u[IU]/mL (ref 0.450–4.500)

## 2018-03-27 LAB — CBC WITH DIFFERENTIAL/PLATELET
Basophils Absolute: 0 10*3/uL (ref 0.0–0.2)
Basos: 1 %
EOS (ABSOLUTE): 0.1 10*3/uL (ref 0.0–0.4)
Eos: 2 %
Hematocrit: 46.7 % (ref 37.5–51.0)
Hemoglobin: 15.6 g/dL (ref 13.0–17.7)
Immature Grans (Abs): 0 10*3/uL (ref 0.0–0.1)
Immature Granulocytes: 0 %
Lymphocytes Absolute: 2.3 10*3/uL (ref 0.7–3.1)
Lymphs: 43 %
MCH: 27.8 pg (ref 26.6–33.0)
MCHC: 33.4 g/dL (ref 31.5–35.7)
MCV: 83 fL (ref 79–97)
Monocytes Absolute: 0.3 10*3/uL (ref 0.1–0.9)
Monocytes: 6 %
Neutrophils Absolute: 2.6 10*3/uL (ref 1.4–7.0)
Neutrophils: 48 %
Platelets: 239 10*3/uL (ref 150–450)
RBC: 5.61 x10E6/uL (ref 4.14–5.80)
RDW: 14.9 % (ref 11.6–15.4)
WBC: 5.4 10*3/uL (ref 3.4–10.8)

## 2018-03-27 LAB — LIPID PANEL
Chol/HDL Ratio: 6.3 ratio — ABNORMAL HIGH (ref 0.0–5.0)
Cholesterol, Total: 202 mg/dL — ABNORMAL HIGH (ref 100–199)
HDL: 32 mg/dL — ABNORMAL LOW (ref >39)
Triglycerides: 468 mg/dL — ABNORMAL HIGH (ref 0–149)

## 2018-03-27 LAB — T4: T4, Total: 7.9 ug/dL (ref 4.5–12.0)

## 2018-03-28 ENCOUNTER — Other Ambulatory Visit: Payer: Self-pay | Admitting: Family Medicine

## 2018-03-29 ENCOUNTER — Telehealth: Payer: Self-pay

## 2018-03-29 NOTE — Telephone Encounter (Signed)
-----   Message from Jodell Cipro [place], [place] sent at 03/28/2018  7:00 PM EDT ----- Alkaline phosphatase elevated due to arthritis. Cholesterol and triglycerides worsening with increase in risk ratio from 5.3 to 6.3. Increase Atorvastatin to 80 mg qd and add Co-Q 10 200 mg qd (OTC). Recheck labs to assess progress in 3 months.

## 2018-03-29 NOTE — Telephone Encounter (Signed)
Unable to reach patient at this time, will try again at a later time. KW 

## 2018-03-30 NOTE — Telephone Encounter (Signed)
No answer and no vm

## 2018-04-01 NOTE — Telephone Encounter (Signed)
Called the patient to review lab results. No answer or voicemail.

## 2018-04-02 NOTE — Telephone Encounter (Signed)
Called number on chart and couldn't get thru.  dbs

## 2018-04-07 ENCOUNTER — Encounter: Payer: Self-pay | Admitting: *Deleted

## 2018-04-07 NOTE — Telephone Encounter (Signed)
I have been unable to reach patient by phone.  A letter is being sent advising him to contact office.

## 2018-04-13 ENCOUNTER — Other Ambulatory Visit: Payer: Self-pay | Admitting: Family Medicine

## 2018-04-27 ENCOUNTER — Other Ambulatory Visit: Payer: Self-pay | Admitting: Family Medicine

## 2018-05-29 ENCOUNTER — Other Ambulatory Visit: Payer: Self-pay | Admitting: Family Medicine

## 2018-05-29 DIAGNOSIS — I10 Essential (primary) hypertension: Secondary | ICD-10-CM

## 2018-07-01 ENCOUNTER — Telehealth: Payer: Self-pay

## 2018-07-01 NOTE — Telephone Encounter (Signed)
Pt calling about x-rays and MRI that he had done. Has some concerns about some things not showing up.   Please call pt back at [phone]  Thank you,  [facility]

## 2018-07-02 NOTE — Telephone Encounter (Signed)
Please advise results? 

## 2018-07-15 ENCOUNTER — Other Ambulatory Visit: Payer: Self-pay | Admitting: Family Medicine

## 2018-09-29 ENCOUNTER — Other Ambulatory Visit: Payer: Self-pay | Admitting: Family Medicine

## 2018-10-21 ENCOUNTER — Other Ambulatory Visit: Payer: Self-pay | Admitting: Family Medicine

## 2018-10-21 DIAGNOSIS — I10 Essential (primary) hypertension: Secondary | ICD-10-CM

## 2018-10-22 ENCOUNTER — Ambulatory Visit
Admission: RE | Admit: 2018-10-22 | Discharge: 2018-10-22 | Disposition: A | Discharge status: Home / Self Care | Payer: Disability Insurance | Source: Ambulatory Visit | Attending: Pediatrics | Admitting: Pediatrics

## 2018-10-22 ENCOUNTER — Other Ambulatory Visit: Payer: Self-pay

## 2018-10-22 ENCOUNTER — Other Ambulatory Visit: Payer: Self-pay | Admitting: Pediatrics

## 2018-10-22 DIAGNOSIS — M1990 Unspecified osteoarthritis, unspecified site: Secondary | ICD-10-CM | POA: Diagnosis present

## 2019-01-14 ENCOUNTER — Other Ambulatory Visit: Payer: Self-pay | Admitting: Family Medicine

## 2019-01-14 DIAGNOSIS — I10 Essential (primary) hypertension: Secondary | ICD-10-CM

## 2019-01-14 NOTE — Telephone Encounter (Signed)
Requested medication (s) are due for refill today: yes  Requested medication (s) are on the active medication list: yes  Last refill:  10/21/2018  Future visit scheduled: no  Notes to clinic:  Patient due for labs and follow up Review for refill   Requested Prescriptions  Pending Prescriptions Disp Refills   verapamil (CALAN) 120 MG tablet [Pharmacy Med Name: VERAPAMIL 120 MG TABLET] 180 tablet 0    Sig: TAKE 2 TABLETS BY MOUTH EVERY DAY      Cardiovascular:  Calcium Channel Blockers Failed - 01/14/2019  8:30 AM      Failed - Valid encounter within last 6 months    Recent Outpatient Visits           9 months ago Prediabetes   [facility], Jodell Cipro, [place]   1 year ago Annual physical exam   [facility], [place], [place]   3 years ago Acrochordon   [facility], [place], [place]              Passed - Last BP in normal range    BP Readings from Last 1 Encounters:  03/26/18 110/70

## 2019-01-14 NOTE — Telephone Encounter (Signed)
One month supply given  Needs f/u appt

## 2019-02-06 ENCOUNTER — Other Ambulatory Visit: Payer: Self-pay | Admitting: Family Medicine

## 2019-02-06 DIAGNOSIS — I10 Essential (primary) hypertension: Secondary | ICD-10-CM

## 2019-02-06 NOTE — Telephone Encounter (Signed)
One month supply provided to patient on 01-14-2019.  Patient should have enough medication to last until his OV on 02-11-19. / Denying this request and patient can address during OV.

## 2019-02-11 ENCOUNTER — Ambulatory Visit (INDEPENDENT_AMBULATORY_CARE_PROVIDER_SITE_OTHER): Payer: Self-pay | Admitting: Family Medicine

## 2019-02-11 ENCOUNTER — Other Ambulatory Visit: Payer: Self-pay | Admitting: Family Medicine

## 2019-02-11 ENCOUNTER — Encounter: Payer: Self-pay | Admitting: Family Medicine

## 2019-02-11 ENCOUNTER — Other Ambulatory Visit: Payer: Self-pay

## 2019-02-11 VITALS — BP 165/99 | HR 91 | Temp 97.1°F | Resp 18 | Ht 70.0 in | Wt 236.0 lb

## 2019-02-11 DIAGNOSIS — E782 Mixed hyperlipidemia: Secondary | ICD-10-CM

## 2019-02-11 DIAGNOSIS — E039 Hypothyroidism, unspecified: Secondary | ICD-10-CM

## 2019-02-11 DIAGNOSIS — I10 Essential (primary) hypertension: Secondary | ICD-10-CM

## 2019-02-11 DIAGNOSIS — Z23 Encounter for immunization: Secondary | ICD-10-CM

## 2019-02-11 DIAGNOSIS — R7303 Prediabetes: Secondary | ICD-10-CM

## 2019-02-11 MED ORDER — LOSARTAN POTASSIUM-HCTZ 50-12.5 MG PO TABS: 1.0000 | ORAL_TABLET | Freq: Every day | ORAL | 3 refills | Status: DC

## 2019-02-11 NOTE — Progress Notes (Signed)
Patient: Francis Gomez Male    DOB: 1960/12/28   59 y.o.   MRN: [id number] Visit Date: 02/11/2019  Today's Provider: Dortha Kern, PA   Chief Complaint  Patient presents with  . Follow-up  . Hypertension  . Hyperlipidemia  . Hypothyroidism   Subjective:     HPI    Hypertension, follow-up:  BP Readings from Last 3 Encounters:  02/11/19 (!) 168/100  03/26/18 110/70  01/20/17 (!) 148/88    He was last seen for hypertension 11 months ago.  BP at that visit was 110/70. Management since that visit includes; no changes.He reports good compliance with treatment. He is not having side effects. none He some walking exercising. He is adherent to low salt diet.   Outside blood pressures are not checking. He is experiencing none.  Patient denies chest pain, chest pressure/discomfort, claudication, dyspnea, exertional chest pressure/discomfort, fatigue, irregular heart beat, lower extremity edema, near-syncope, orthopnea, palpitations, paroxysmal nocturnal dyspnea, syncope and tachypnea.   Cardiovascular risk factors include dyslipidemia, hypertension and male gender.  Use of agents associated with hypertension: none.   -----------------------------------------------------------    Lipid/Cholesterol, Follow-up:   Last seen for this 11 months ago.  Management since that visit includes; labs checked. Increased Atorvastatin to 80 mg qd and add Co-Q 10 200 mg qd (OTC). Recheck labs to assess progress in 3 months.  Last Lipid Panel:    Component Value Date/Time   CHOL 202 (H) 03/26/2018 1135   TRIG 468 (H) 03/26/2018 1135   HDL 32 (L) 03/26/2018 1135   CHOLHDL 6.3 (H) 03/26/2018 1135   LDLCALC Comment 03/26/2018 1135    He reports good compliance with treatment. He is not having side effects. none  Wt Readings from Last 3 Encounters:  02/11/19 236 lb (107 kg)  03/26/18 230 lb (104.3 kg)  01/20/17 231 lb 6.4 oz (105 kg)     -----------------------------------------------------------  Prediabetes From 03/26/2018-Hgb A1C 6.4. no changes were made.  Hypothyroidism, unspecified type From 03/26/2018-labs checked, no changes were made.  History reviewed. No pertinent past medical history. Patient Active Problem List   Diagnosis Date Noted  . 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 Hypothyroidism 01/20/2017  . Lumbago with sciatica 06/09/2016  . Prediabetes 05/14/2015  . Hypertension 05/14/2015  . Bilateral chronic knee pain 02/15/2015  . Osteoarthritis of left knee 02/15/2015  . Patellofemoral stress syndrome of left knee 02/15/2015   Past Surgical History:  Procedure Laterality Date  . KNEE SURGERY Left    x's 2   Family History  Problem Relation Age of Onset  . Diabetes Mother   . Heart disease Father 36       MI  . Heart disease Sister    Allergies  Allergen Reactions  . Amlodipine Itching    Current Outpatient Medications:  .  atorvastatin (LIPITOR) 80 MG tablet, TAKE 1 TABLET BY MOUTH EVERY DAY, Disp: 90 tablet, Rfl: 1 .  levothyroxine (SYNTHROID) 25 MCG tablet, TAKE 1 TABLET (25 MCG TOTAL) BY MOUTH DAILY-- BEFORE BREAKFAST., Disp: 90 tablet, Rfl: 3 .  verapamil (CALAN) 120 MG tablet, TAKE 2 TABLETS BY MOUTH EVERY DAY, Disp: 60 tablet, Rfl: 0 .  etodolac (LODINE) 200 MG capsule, Take 1 capsule (200 mg total) by mouth every 8 (eight) hours. (Patient not taking: Reported on 03/26/2018), Disp: 40 capsule, Rfl: 3 .  hydrochlorothiazide (HYDRODIURIL) 12.5 MG tablet, Take 1 tablet (12.5 mg total) by mouth daily. (Patient not taking: Reported on 02/11/2019), Disp: 90 tablet, Rfl:  3 .  losartan (COZAAR) 50 MG tablet, Take 1 tablet (50 mg total) by mouth daily. (Patient not taking: Reported on 02/11/2019), Disp: 90 tablet, Rfl: 3  Review of Systems  Constitutional: Negative for appetite change, chills and fever.  Respiratory: Negative for chest tightness, shortness of breath and wheezing.    Cardiovascular: Negative for chest pain and palpitations.  Gastrointestinal: Negative for abdominal pain, nausea and vomiting.   Social History   Tobacco Use  . Smoking status: Never Smoker  . Smokeless tobacco: Never Used  Substance Use Topics  . Alcohol use: No     Objective:   BP (!) 168/100 (BP Location: Right Arm, Patient Position: Sitting, Cuff Size: Large)   Pulse 90   Temp (!) 97.1 F (36.2 C) (Other (Comment))   Resp 18   Ht 5\' 10"  (1.778 m)   Wt 236 lb (107 kg)   SpO2 93%   BMI 33.86 kg/m  Vitals:   02/11/19 1418  BP: (!) 168/100  Pulse: 90  Resp: 18  Temp: (!) 97.1 F (36.2 C)  TempSrc: Other (Comment)  SpO2: 93%  Weight: 236 lb (107 kg)  Height: 5\' 10"  (1.778 m)  Body mass index is 33.86 kg/m.  Physical Exam Constitutional:      General: He is not in acute distress.    Appearance: He is well-developed.  HENT:     Head: Normocephalic and atraumatic.     Right Ear: Hearing and tympanic membrane normal.     Left Ear: Hearing and tympanic membrane normal.     Nose: Nose normal.  Eyes:     General: Lids are normal. No scleral icterus.       Right eye: No discharge.        Left eye: No discharge.     Conjunctiva/sclera: Conjunctivae normal.  Cardiovascular:     Rate and Rhythm: Normal rate.     Heart sounds: Normal heart sounds.  Pulmonary:     Effort: Pulmonary effort is normal. No respiratory distress.     Breath sounds: Normal breath sounds.  Abdominal:     General: Bowel sounds are normal.     Palpations: Abdomen is soft.  Musculoskeletal:        General: Normal range of motion.     Right lower leg: No edema.     Left lower leg: No edema.  Skin:    Findings: No lesion or rash.  Neurological:     Mental Status: He is alert and oriented to person, place, and time.  Psychiatric:        Speech: Speech normal.        Behavior: Behavior normal.        Thought Content: Thought content normal.       Assessment & Plan    1. Essential  hypertension BP elevated today. No dizziness, edema, chest pains, dyspnea or palpitations. Still taking Verapamil 120 mg 2 tablets daily but ran out of the HCTZ 12.5 mg qd and Losartan 50 mg qd. Will refill Hyzaar and recheck routine labs. Follow up pending reports. - CBC with Differential/Platelet - Comprehensive metabolic panel - Lipid panel - TSH - losartan-hydrochlorothiazide (HYZAAR) 50-12.5 MG tablet; Take 1 tablet by mouth daily.  Dispense: 90 tablet; Refill: 3  2. Hypothyroidism, unspecified type Still taking Synthroid 25 mcg qd. Denies tremor, constipation, palpitations or hair loss. Recheck labs and continue present dosage. - Comprehensive metabolic panel - TSH - T4  3. Prediabetes Occasional "pins & needles" sensation  in hands. No polyuria, polydipsia, polyphagia or blurred vision. Last Hgb A1C on 03-26-18 was 6.4%. Family history positive for mother having diabetes. Will recheck labs and encouraged to restrict sugar in diet. - Comprehensive metabolic panel - Lipid panel - Hemoglobin A1c  4. Mixed hyperlipidemia Still taking Atorvastatin 80 mg qd but has gained 6 lbs since 03-26-18 when total cholesterol was 202, HDL 32 and triglycerides was 468. Continue present dosage with Co-Q 10 200 mg qd. Will recheck CMP, TSH and Lipid Panel fasting.    Dortha Kern, PA  [facility]

## 2019-02-11 NOTE — Addendum Note (Signed)
Addended by: Marlene Lard on: 02/11/2019 03:56 PM   Modules accepted: Orders

## 2019-02-15 ENCOUNTER — Other Ambulatory Visit: Payer: Self-pay

## 2019-02-15 DIAGNOSIS — E119 Type 2 diabetes mellitus without complications: Secondary | ICD-10-CM

## 2019-02-15 LAB — COMPREHENSIVE METABOLIC PANEL
ALT: 54 IU/L — ABNORMAL HIGH (ref 0–44)
AST: 32 IU/L (ref 0–40)
Albumin/Globulin Ratio: 1.5 (ref 1.2–2.2)
Albumin: 4.5 g/dL (ref 3.8–4.9)
Alkaline Phosphatase: 189 IU/L — ABNORMAL HIGH (ref 39–117)
BUN/Creatinine Ratio: 7 — ABNORMAL LOW (ref 9–20)
BUN: 9 mg/dL (ref 6–24)
Bilirubin Total: 0.5 mg/dL (ref 0.0–1.2)
CO2: 20 mmol/L (ref 20–29)
Calcium: 9.3 mg/dL (ref 8.7–10.2)
Chloride: 103 mmol/L (ref 96–106)
Creatinine, Ser: 1.31 mg/dL — ABNORMAL HIGH (ref 0.76–1.27)
GFR calc Af Amer: 69 mL/min/{1.73_m2} (ref >59)
GFR calc non Af Amer: 60 mL/min/{1.73_m2} (ref >59)
Globulin, Total: 3 g/dL (ref 1.5–4.5)
Glucose: 113 mg/dL — ABNORMAL HIGH (ref 65–99)
Potassium: 4 mmol/L (ref 3.5–5.2)
Sodium: 141 mmol/L (ref 134–144)
Total Protein: 7.5 g/dL (ref 6.0–8.5)

## 2019-02-15 LAB — CBC WITH DIFFERENTIAL/PLATELET
Basophils Absolute: 0 10*3/uL (ref 0.0–0.2)
Basos: 1 %
EOS (ABSOLUTE): 0.2 10*3/uL (ref 0.0–0.4)
Eos: 3 %
Hematocrit: 46.6 % (ref 37.5–51.0)
Hemoglobin: 15.6 g/dL (ref 13.0–17.7)
Immature Grans (Abs): 0 10*3/uL (ref 0.0–0.1)
Immature Granulocytes: 0 %
Lymphocytes Absolute: 2.6 10*3/uL (ref 0.7–3.1)
Lymphs: 44 %
MCH: 27.3 pg (ref 26.6–33.0)
MCHC: 33.5 g/dL (ref 31.5–35.7)
MCV: 82 fL (ref 79–97)
Monocytes Absolute: 0.3 10*3/uL (ref 0.1–0.9)
Monocytes: 5 %
Neutrophils Absolute: 2.8 10*3/uL (ref 1.4–7.0)
Neutrophils: 47 %
Platelets: 213 10*3/uL (ref 150–450)
RBC: 5.72 x10E6/uL (ref 4.14–5.80)
RDW: 14.3 % (ref 11.6–15.4)
WBC: 5.9 10*3/uL (ref 3.4–10.8)

## 2019-02-15 LAB — LIPID PANEL
Chol/HDL Ratio: 4.5 ratio (ref 0.0–5.0)
Cholesterol, Total: 153 mg/dL (ref 100–199)
HDL: 34 mg/dL — ABNORMAL LOW (ref >39)
LDL Chol Calc (NIH): 70 mg/dL (ref 0–99)
Triglycerides: 305 mg/dL — ABNORMAL HIGH (ref 0–149)
VLDL Cholesterol Cal: 49 mg/dL — ABNORMAL HIGH (ref 5–40)

## 2019-02-15 LAB — HEMOGLOBIN A1C
Est. average glucose Bld gHb Est-mCnc: 148 mg/dL
Hgb A1c MFr Bld: 6.8 % — ABNORMAL HIGH (ref 4.8–5.6)

## 2019-02-15 LAB — T4: T4, Total: 7 ug/dL (ref 4.5–12.0)

## 2019-02-15 LAB — TSH: TSH: 2.94 u[IU]/mL (ref 0.450–4.500)

## 2019-02-15 MED ORDER — METFORMIN HCL 500 MG PO TABS: 500.0000 mg | ORAL_TABLET | Freq: Every day | ORAL | 3 refills | Status: DC

## 2019-02-22 ENCOUNTER — Other Ambulatory Visit: Payer: Self-pay | Admitting: Family Medicine

## 2019-02-22 DIAGNOSIS — I10 Essential (primary) hypertension: Secondary | ICD-10-CM

## 2019-02-22 NOTE — Telephone Encounter (Signed)
Requested Prescriptions  Pending Prescriptions Disp Refills  . verapamil (CALAN) 120 MG tablet [Pharmacy Med Name: VERAPAMIL 120 MG TABLET] 60 tablet 0    Sig: TAKE 2 TABLETS BY MOUTH EVERY DAY     Cardiovascular:  Calcium Channel Blockers Failed - 02/22/2019 11:07 AM      Failed - Last BP in normal range    BP Readings from Last 1 Encounters:  02/11/19 (!) 165/99         Passed - Valid encounter within last 6 months    Recent Outpatient Visits          1 week ago Essential hypertension   [facility], Jodell Cipro, [place]   11 months ago Prediabetes   [facility], Jodell Cipro, [place]   2 years ago Annual physical exam   [facility], Jodell Cipro, [place]   3 years ago Acrochordon   [facility], [place], [place]

## 2019-03-10 ENCOUNTER — Encounter: Payer: Disability Insurance | Attending: Family Medicine | Admitting: Dietician

## 2019-03-10 ENCOUNTER — Encounter: Payer: Self-pay | Admitting: Dietician

## 2019-03-10 ENCOUNTER — Other Ambulatory Visit: Payer: Self-pay

## 2019-03-10 VITALS — Ht 70.0 in | Wt 231.9 lb

## 2019-03-10 DIAGNOSIS — Z713 Dietary counseling and surveillance: Secondary | ICD-10-CM | POA: Insufficient documentation

## 2019-03-10 DIAGNOSIS — E119 Type 2 diabetes mellitus without complications: Secondary | ICD-10-CM

## 2019-03-10 NOTE — Patient Instructions (Addendum)
   Try a protein drink at lunchtime, OK to start with 1/2 to make sure you are hungry for supper meal.   Control portion of orange juice in the mornings to prevent high blood sugar, consider trying juice with less sugar, like [place] 50.

## 2019-03-10 NOTE — Progress Notes (Signed)
Medical Nutrition Therapy: Visit start time: 1110  end time: 1210  Assessment:  Diagnosis: Type 2 DM Past medical history: HTN, HLD, arthritis in legs Psychosocial issues/ stress concerns: patient reports depression symptoms due to leg pain  Preferred learning method:  . No preference indicated  Current weight: 231.9lbs Height: 5'10" Medications, supplements: reconciled list in medical record  Progress and evaluation:   Patient reports recent diagnosis of diabetes; recent HbA1C was 6.8%. He has not been advised to begin testing BGs at home at this point.    He reports poor appetite for most of his life, eats 1-2x a day. He has few foods that he will eat. He states his brother has the same issue. Patient states he had been slim most of his life, until he started taking a multivitamin a few years ago and experienced an increase in his appetite along with weight gain, so stopped the vitamin.  He denies any digestive problems, other than recent mild nausea when taking Metformin.   Arthritis pain in his legs has led to depressed mood, and little physical activity.   Physical activity: none  Dietary Intake:  Usual eating pattern includes 2 meals and 0 snacks per day. Dining out frequency: 3-5 meals per week.  Breakfast: usually cereal (frosted flakes or sugar smacks) 3/4- 1 1/2 cup portions with dairy milk and OJ with thyroid meds 1-2 hours ac breakfast. Does not eat eggs, cheese, or nuts Snack: none Lunch: none Snack: none Supper: 2pcs chicken from [facility]-- removes crust, + potatoes, no bread/ fast food burger-- removes 1/2 bun, sometimes eats fries/ brother (not the one with poor appetite) cooks -- pt eats chicken, occ pork chop, likes potatoes, some rice, rarely green beans Snack: none Beverages: water, OJ in am, occasional small amount soda  Nutrition Care Education: Topics covered:  Basic nutrition: basic food groups, appropriate nutrient balance, appropriate meal and snack schedule,  general nutrition guidelines    Weight control: gradual re-feeding to avoid weight gain Diabetes: appropriate meal and snack schedule, appropriate carb intake and balance, healthy carb choices, role of protein, fat Other: importance of adequate nutrition; effects of very low calorie intake on metabolism and appetite; factors that can decrease appetite including pain, medications, depression, thyroid function  Nutritional Diagnosis:  [place]-2.2 Altered nutrition-related laboratory As related to type 2 diabetes.  As evidenced by patient with recent HbA1C of 6.8%. [place]-3.3 Overweight/obesity As related to inactivity, hypothyroidism, history of excess calories.  As evidenced by patient with current BMI of 33.27. [id number] Predicted suboptimal nutrient intake As related to poor appetite.  As evidenced by infrequent eating and limited variety of food intake.  Intervention:   Instruction and discussion as noted above.  Significant diet change will be difficult for patient given long history of poor appetite. Advised small, gradual increase in caloric intake to promote better nutrition overall as well as improved blood sugars and weight.   Established small goals for change with direction from patient.   Education Materials given:  . General diet guidelines for Diabetes . Plate Planner with food lists, sample meal pattern . Goals/ instructions   Learner/ who was taught:  . Patient   Level of understanding: Marland Kitchen Verbalizes/ demonstrates competency   Demonstrated degree of understanding via:   Teach back Learning barriers: . None  Willingness to learn/ readiness for change: . Hesitance, contemplating change   Monitoring and Evaluation:  Dietary intake, exercise, BG control, and body weight      follow up: 04/20/19 at 11:00am

## 2019-03-20 ENCOUNTER — Other Ambulatory Visit: Payer: Self-pay | Admitting: Family Medicine

## 2019-03-20 DIAGNOSIS — I10 Essential (primary) hypertension: Secondary | ICD-10-CM

## 2019-03-20 NOTE — Telephone Encounter (Signed)
Requested Prescriptions  Pending Prescriptions Disp Refills  . verapamil (CALAN) 120 MG tablet [Pharmacy Med Name: VERAPAMIL 120 MG TABLET] 180 tablet 1    Sig: TAKE 2 TABLETS BY MOUTH EVERY DAY     Cardiovascular:  Calcium Channel Blockers Failed - 03/20/2019  8:31 AM      Failed - Last BP in normal range    BP Readings from Last 1 Encounters:  02/11/19 (!) 165/99         Passed - Valid encounter within last 6 months    Recent Outpatient Visits          1 month ago Essential hypertension   [facility], Jodell Cipro, [place]   11 months ago Prediabetes   [facility], Jodell Cipro, [place]   2 years ago Annual physical exam   [facility], Jodell Cipro, [place]   3 years ago Acrochordon   [facility], [place], [place]

## 2019-04-14 ENCOUNTER — Other Ambulatory Visit: Payer: Self-pay | Admitting: Family Medicine

## 2019-04-20 ENCOUNTER — Ambulatory Visit: Payer: Disability Insurance | Admitting: Dietician

## 2019-04-27 ENCOUNTER — Other Ambulatory Visit: Payer: Self-pay | Admitting: Family Medicine

## 2019-04-27 NOTE — Telephone Encounter (Signed)
Attempted to call patient to schedule follow up appointment- VM not set up- unable to leave message. Note to pharmacy included with RF

## 2019-04-29 ENCOUNTER — Encounter: Payer: Self-pay | Admitting: Family Medicine

## 2019-04-29 ENCOUNTER — Other Ambulatory Visit: Payer: Self-pay

## 2019-04-29 ENCOUNTER — Ambulatory Visit (INDEPENDENT_AMBULATORY_CARE_PROVIDER_SITE_OTHER): Payer: Self-pay | Admitting: Family Medicine

## 2019-04-29 VITALS — BP 156/104 | HR 85 | Temp 97.5°F | Resp 18 | Ht 70.0 in | Wt 232.0 lb

## 2019-04-29 DIAGNOSIS — I10 Essential (primary) hypertension: Secondary | ICD-10-CM

## 2019-04-29 DIAGNOSIS — M62838 Other muscle spasm: Secondary | ICD-10-CM

## 2019-04-29 DIAGNOSIS — G8929 Other chronic pain: Secondary | ICD-10-CM

## 2019-04-29 DIAGNOSIS — M25562 Pain in left knee: Secondary | ICD-10-CM

## 2019-04-29 DIAGNOSIS — M25561 Pain in right knee: Secondary | ICD-10-CM

## 2019-04-29 MED ORDER — PREDNISONE 10 MG PO TABS: ORAL_TABLET | ORAL | 0 refills | Status: DC

## 2019-04-29 MED ORDER — TIZANIDINE HCL 4 MG PO CAPS: 4.0000 mg | ORAL_CAPSULE | Freq: Three times a day (TID) | ORAL | 0 refills | Status: DC | PRN

## 2019-04-29 NOTE — Patient Instructions (Signed)
Muscle Cramps and Spasms °Muscle cramps and spasms occur when a muscle or muscles tighten and you have no control over this tightening (involuntary muscle contraction). They are a common problem and can develop in any muscle. The most common place is in the calf muscles of the leg. Muscle cramps and muscle spasms are both involuntary muscle contractions, but there are some differences between the two: °· Muscle cramps are painful. They come and go and may last for a few seconds or up to 15 minutes. Muscle cramps are often more forceful and last longer than muscle spasms. °· Muscle spasms may or may not be painful. They may also last just a few seconds or much longer. °Certain medical conditions, such as diabetes or Parkinson's disease, can make it more likely to develop cramps or spasms. However, cramps or spasms are usually not caused by a serious underlying problem. Common causes include: °· Doing more physical work or exercise than your body is ready for (overexertion). °· Overuse from repeating certain movements too many times. °· Remaining in a certain position for a long period of time. °· Improper preparation, form, or technique while playing a sport or doing an activity. °· Dehydration. °· Injury. °· Side effects of some medicines. °· Abnormally low levels of the salts and minerals in your blood (electrolytes), especially potassium and calcium. This could happen if you are taking water pills (diuretics) or if you are pregnant. °In many cases, the cause of muscle cramps or spasms is not known. °Follow these instructions at home: °Managing pain and stiffness ° °  ° °· Try massaging, stretching, and relaxing the affected muscle. Do this for several minutes at a time. °· If directed, apply heat to tight or tense muscles as often as told by your health care provider. Use the heat source that your health care provider recommends, such as a moist heat pack or a heating pad. °? Place a towel between your skin and  the heat source. °? Leave the heat on for 20-30 minutes. °? Remove the heat if your skin turns bright red. This is especially important if you are unable to feel pain, heat, or cold. You may have a greater risk of getting burned. °· If directed, put ice on the affected area. This may help if you are sore or have pain after a cramp or spasm. °? Put ice in a plastic bag. °? Place a towel between your skin and the bag. °? Leave the ice on for 20 minutes, 2-3 times a day. °· Try taking hot showers or baths to help relax tight muscles. °Eating and drinking °· Drink enough fluid to keep your urine pale yellow. Staying well hydrated may help prevent cramps or spasms. °· Eat a healthy diet that includes plenty of nutrients to help your muscles function. A healthy diet includes fruits and vegetables, lean protein, whole grains, and low-fat or nonfat dairy products. °General instructions °· If you are having frequent cramps, avoid intense exercise for several days. °· Take over-the-counter and prescription medicines only as told by your health care provider. °· Pay attention to any changes in your symptoms. °· Keep all follow-up visits as told by your health care provider. This is important. °Contact a health care provider if: °· Your cramps or spasms get more severe or happen more often. °· Your cramps or spasms do not improve over time. °Summary °· Muscle cramps and spasms occur when a muscle or muscles tighten and you have no control over this   tightening (involuntary muscle contraction). °· The most common place for cramps or spasms to occur is in the calf muscles of the leg. °· Massaging, stretching, and relaxing the affected muscle may relieve the cramp or spasm. °· Drink enough fluid to keep your urine pale yellow. Staying well hydrated may help prevent cramps or spasms. °This information is not intended to replace advice given to you by your health care provider. Make sure you discuss any questions you have with your  health care provider. °Document Revised: 05/18/2017 Document Reviewed: 05/18/2017 °Elsevier Patient Education © 2020 [employer] ° °

## 2019-04-29 NOTE — Progress Notes (Signed)
Established patient visit  I,Francis Gomez,acting as a scribe for [employer].,have documented all relevant documentation on the behalf of [employer],as directed by  [employer] while in the presence of [employer], [place].   Patient: Francis Gomez   DOB: [birth year]   59 y.o. Male  MRN: [id number] Visit Date: 04/29/2019  Today's healthcare provider: Vernie Murders, PA   Chief Complaint  Patient presents with  . Neck Pain   Subjective    Neck Pain  This is a new problem. The current episode started 1 to 4 weeks ago (2 weeks ago). The problem occurs constantly. The problem has been gradually worsening. The pain is associated with nothing. The pain is present in the right side. The quality of the pain is described as aching. The pain is at a severity of 10/10. The pain is severe. The symptoms are aggravated by position, coughing, twisting and bending. The pain is same all the time. Pertinent negatives include no chest pain, fever, headaches, leg pain, numbness, pain with swallowing, paresis, photophobia, syncope, tingling, trouble swallowing, visual change, weakness or weight loss. He has tried nothing for the symptoms.   Patient states he woke up 2 weeks ago with right sided neck pain. Patient states pain is constant and aching. Patient states pain is the same all the time. Patient has not been treating neck pain at home.      Medications: Outpatient Medications Prior to Visit  Medication Sig  . acetaminophen (TYLENOL) 325 MG tablet Take by mouth. Arthritis formula  . atorvastatin (LIPITOR) 80 MG tablet TAKE 1 TABLET BY MOUTH EVERY DAY  . levothyroxine (SYNTHROID) 25 MCG tablet TAKE 1 TABLET (25 MCG TOTAL) BY MOUTH DAILY-- BEFORE BREAKFAST.  Marland Kitchen losartan-hydrochlorothiazide (HYZAAR) 50-12.5 MG tablet Take 1 tablet by mouth daily.  . metFORMIN (GLUCOPHAGE) 500 MG tablet TAKE 1 TABLET BY MOUTH EVERY DAY WITH BREAKFAST  . verapamil (CALAN) 120 MG tablet TAKE 2  TABLETS BY MOUTH EVERY DAY  . influenza vac split quadrivalent PF (FLUARIX) 0.5 ML injection Fluzone Quad 2018-19(PF) 60 mcg(15 mcgx4)/0.5 mL intramuscular syringe  TO BE ADMINISTERED BY PHARMACIST FOR IMMUNIZATION  . meloxicam (MOBIC) 15 MG tablet meloxicam 15 mg tablet  TAKE 1 TABLET BY MOUTH EVERY DAY   No facility-administered medications prior to visit.    Review of Systems  Constitutional: Negative for appetite change, chills, fever and weight loss.  HENT: Negative for trouble swallowing.   Eyes: Negative for photophobia.  Respiratory: Negative for chest tightness, shortness of breath and wheezing.   Cardiovascular: Negative for chest pain, palpitations and syncope.  Gastrointestinal: Negative for abdominal pain, nausea and vomiting.  Musculoskeletal: Positive for neck pain.  Neurological: Negative for tingling, weakness, numbness and headaches.       Objective    BP (!) 156/104 (BP Location: Right Arm, Cuff Size: Large)   Pulse 85   Temp (!) 97.5 F (36.4 C) (Other (Comment))   Resp 18   Ht 5\' 10"  (1.778 m)   Wt 232 lb (105.2 kg)   SpO2 92%   BMI 33.29 kg/m  BP Readings from Last 3 Encounters:  04/29/19 (!) 156/104  02/11/19 (!) 165/99  03/26/18 110/70   Wt Readings from Last 3 Encounters:  04/29/19 232 lb (105.2 kg)  03/10/19 231 lb 14.4 oz (105.2 kg)  02/11/19 236 lb (107 kg)   Physical Exam Vitals and nursing note reviewed.  Constitutional:      Appearance: Normal appearance. He is  normal weight.  Eyes:     Conjunctiva/sclera: Conjunctivae normal.  Cardiovascular:     Rate and Rhythm: Normal rate and regular rhythm.     Pulses: Normal pulses.     Heart sounds: Normal heart sounds.  Pulmonary:     Effort: Pulmonary effort is normal.     Breath sounds: Normal breath sounds.  Musculoskeletal:        General: Tenderness present.     Cervical back: Normal range of motion and neck supple.     Comments: Right cervical muscles with spasms intermittently.  Unable to rotate head fully to the right without shape pains. Some tenderness to palpate the neck muscles on the right. No upper extremity weakness and good DTR's.  Neurological:     Mental Status: He is alert.     Gait: Gait abnormal.     Deep Tendon Reflexes: Reflexes normal.     Comments: Secondary to chronic bilateral knee pains. No pathology found today in knees.  Psychiatric:        Mood and Affect: Mood normal.        Behavior: Behavior normal.        Thought Content: Thought content normal.        Judgment: Judgment normal.      Assessment & Plan    1. Cervical paraspinous muscle spasm Woke up with sharp pains and stiffness in the right side of his neck over the past couple weeks. No specific injury known. Denies repetitive lifting or exercise. States he cannot take Meloxicam or Etodolac due to constipation side effect. No help from topical creams/rubs. Recommend moist heat applications and treat with muscle relaxant and prednisone taper. Recheck if no better in 5-7 days. - predniSONE (DELTASONE) 10 MG tablet; Taper down dosage by 1 tablet daily starting at 6 day 1, 5 day 2, 4 day 3, 3 day 4, 2 day 5 and 1 day 6 by mouth divided among meals and bedtime.  Dispense: 21 tablet; Refill: 0 - tiZANidine (ZANAFLEX) 4 MG capsule; Take 1 capsule (4 mg total) by mouth 3 (three) times daily as needed for muscle spasms.  Dispense: 21 capsule; Refill: 0  2. Essential hypertension Elevated BP suspected due to cervical strain and spasms. Continue Hyzaar 50-12.5 mg qd and Verapamil 120 mg 2 tablets daily. He will monitor BP at home and call report of readings in 3 days if still elevated at home. Encouraged to limit salt and caffeine intake.  3. Bilateral chronic knee pain Chronic bilateral knee pains and low back pain without documented pathology on imaging by this office. Could not use Meloxicam or Etodolac due to constipation side effect. Will try a prednisone taper. Requesting letter to send to  his lawyer trying to get disability due to chronic pains and inability to work. - predniSONE (DELTASONE) 10 MG tablet; Taper down dosage by 1 tablet daily starting at 6 day 1, 5 day 2, 4 day 3, 3 day 4, 2 day 5 and 1 day 6 by mouth divided among meals and bedtime.  Dispense: 21 tablet; Refill: 0   No follow-ups on file.      Haywood Pao, PA, have reviewed all documentation for this visit. The documentation on 04/29/19 for the exam, diagnosis, procedures, and orders are all accurate and complete.    Dortha Kern, PA  [facility] [phone] (phone) [phone] (fax)  [facility]

## 2019-05-12 ENCOUNTER — Encounter: Payer: Self-pay | Admitting: Dietician

## 2019-05-12 NOTE — Progress Notes (Signed)
Have not heard back from patient to reschedule his missed appointment from 04/20/19. Sent notification to referring provider.

## 2019-07-20 ENCOUNTER — Other Ambulatory Visit: Payer: Self-pay | Admitting: Family Medicine

## 2019-07-20 NOTE — Telephone Encounter (Signed)
Requested Prescriptions  Pending Prescriptions Disp Refills   metFORMIN (GLUCOPHAGE) 500 MG tablet [Pharmacy Med Name: METFORMIN HCL 500 MG TABLET] 90 tablet 0    Sig: TAKE 1 TABLET BY MOUTH EVERY DAY WITH BREAKFAST     Endocrinology:  Diabetes - Biguanides Failed - 07/20/2019  1:20 AM      Failed - Cr in normal range and within 360 days    Creatinine, Ser  Date Value Ref Range Status  02/14/2019 1.31 (H) 0.76 - 1.27 mg/dL Final   Creatinine, POC  Date Value Ref Range Status  01/20/2017 NA mg/dL Final         Passed - HBA1C is between 0 and 7.9 and within 180 days    Hemoglobin A1C  Date Value Ref Range Status  10/16/2015 6.2  Final   Hgb A1c MFr Bld  Date Value Ref Range Status  02/14/2019 6.8 (H) 4.8 - 5.6 % Final    Comment:             Prediabetes: 5.7 - 6.4          Diabetes: >6.4          Glycemic control for adults with diabetes: <7.0          Passed - eGFR in normal range and within 360 days    GFR calc Af Amer  Date Value Ref Range Status  02/14/2019 69 >59 mL/min/1.73 Final   GFR calc non Af Amer  Date Value Ref Range Status  02/14/2019 60 >59 mL/min/1.73 Final         Passed - Valid encounter within last 6 months    Recent Outpatient Visits          2 months ago Cervical paraspinous muscle spasm   [place], Vickki Muff, [place]   5 months ago Essential hypertension   [facility], Vickki Muff, [place]   1 year ago Prediabetes   [facility], Vickki Muff, [place]   2 years ago Annual physical exam   [facility], Vickki Muff, [place]   3 years ago [place], [place], [place]             One month Courtesy refill provided due to patient being due to office visit and labs.

## 2019-08-08 ENCOUNTER — Other Ambulatory Visit: Payer: Self-pay | Admitting: Family Medicine

## 2019-08-08 DIAGNOSIS — I10 Essential (primary) hypertension: Secondary | ICD-10-CM

## 2019-08-16 ENCOUNTER — Other Ambulatory Visit: Payer: Self-pay | Admitting: Family Medicine

## 2019-08-16 NOTE — Telephone Encounter (Signed)
Requested medication (s) are due for refill today: yes  Requested medication (s) are on the active medication list: yes  Last refill:  07/20/19-this was a courtesy RF  Future visit scheduled: no  Notes to clinic:  Attempted to call pt but VM not set up to LM- overdue for labs, f/u   Requested Prescriptions  Pending Prescriptions Disp Refills   metFORMIN (GLUCOPHAGE) 500 MG tablet [Pharmacy Med Name: METFORMIN HCL 500 MG TABLET] 30 tablet 0    Sig: TAKE 1 TABLET BY [place]      Endocrinology:  Diabetes - Biguanides Failed - 08/16/2019  2:34 PM      Failed - Cr in normal range and within 360 days    Creatinine, Ser  Date Value Ref Range Status  02/14/2019 1.31 (H) 0.76 - 1.27 mg/dL Final   Creatinine, POC  Date Value Ref Range Status  01/20/2017 NA mg/dL Final          Failed - HBA1C is between 0 and 7.9 and within 180 days    Hemoglobin A1C  Date Value Ref Range Status  10/16/2015 6.2  Final   Hgb A1c MFr Bld  Date Value Ref Range Status  02/14/2019 6.8 (H) 4.8 - 5.6 % Final    Comment:             Prediabetes: 5.7 - 6.4          Diabetes: >6.4          Glycemic control for adults with diabetes: <7.0           Passed - eGFR in normal range and within 360 days    GFR calc Af Amer  Date Value Ref Range Status  02/14/2019 69 >59 mL/min/1.73 Final   GFR calc non Af Amer  Date Value Ref Range Status  02/14/2019 60 >59 mL/min/1.73 Final          Passed - Valid encounter within last 6 months    Recent Outpatient Visits           3 months ago Cervical paraspinous muscle spasm   [place], Vickki Muff, [place]   6 months ago Essential hypertension   [facility], Vickki Muff, [place]   1 year ago Prediabetes   [facility], Vickki Muff, [place]   2 years ago Annual physical exam   [facility], Vickki Muff, [place]   4 years ago [facility], [place], [place]

## 2019-08-24 ENCOUNTER — Other Ambulatory Visit: Payer: Self-pay | Admitting: Family Medicine

## 2019-08-24 MED ORDER — METFORMIN HCL 500 MG PO TABS: ORAL_TABLET | ORAL | 3 refills | Status: DC

## 2019-08-24 NOTE — Telephone Encounter (Signed)
Copied from CRM [id number]. Topic: Quick Communication - Rx Refill/Question >> Aug 24, 2019 10:40 AM Marylen Ponto wrote: Medication: metFORMIN (GLUCOPHAGE) 500 MG tablet  Has the patient contacted their pharmacy? yes   Preferred Pharmacy (with phone number or street name): [id number] - [place], [place] - [address]  Phone: [phone]  Fax: [phone]  Agent: Please be advised that RX refills may take up to 3 business days. We ask that you follow-up with your pharmacy.

## 2019-08-24 NOTE — Telephone Encounter (Signed)
Requested medication (s) are due for refill today: yes  Requested medication (s) are on the active medication list: yes  Last refill:  07/20/2019  Future visit scheduled: no  Notes to clinic:  Patient given courtesy refill Attempted to contact patient no answer and no vm    Requested Prescriptions  Pending Prescriptions Disp Refills   metFORMIN (GLUCOPHAGE) 500 MG tablet 30 tablet 0      Endocrinology:  Diabetes - Biguanides Failed - 08/24/2019 10:45 AM      Failed - Cr in normal range and within 360 days    Creatinine, Ser  Date Value Ref Range Status  02/14/2019 1.31 (H) 0.76 - 1.27 mg/dL Final   Creatinine, POC  Date Value Ref Range Status  01/20/2017 NA mg/dL Final          Failed - HBA1C is between 0 and 7.9 and within 180 days    Hemoglobin A1C  Date Value Ref Range Status  10/16/2015 6.2  Final   Hgb A1c MFr Bld  Date Value Ref Range Status  02/14/2019 6.8 (H) 4.8 - 5.6 % Final    Comment:             Prediabetes: 5.7 - 6.4          Diabetes: >6.4          Glycemic control for adults with diabetes: <7.0           Passed - eGFR in normal range and within 360 days    GFR calc Af Amer  Date Value Ref Range Status  02/14/2019 69 >59 mL/min/1.73 Final   GFR calc non Af Amer  Date Value Ref Range Status  02/14/2019 60 >59 mL/min/1.73 Final          Passed - Valid encounter within last 6 months    Recent Outpatient Visits           3 months ago Cervical paraspinous muscle spasm   [place], Vickki Muff, [place]   6 months ago Essential hypertension   [facility], Vickki Muff, [place]   1 year ago Prediabetes   [facility], Vickki Muff, [place]   2 years ago Annual physical exam   [facility], Vickki Muff, [place]   4 years ago [place], [place], [place]

## 2019-08-29 ENCOUNTER — Ambulatory Visit: Payer: Self-pay | Admitting: Physician Assistant

## 2019-09-01 ENCOUNTER — Other Ambulatory Visit: Payer: Self-pay

## 2019-09-01 ENCOUNTER — Ambulatory Visit (INDEPENDENT_AMBULATORY_CARE_PROVIDER_SITE_OTHER): Payer: Self-pay | Admitting: Physician Assistant

## 2019-09-01 ENCOUNTER — Encounter: Payer: Self-pay | Admitting: Physician Assistant

## 2019-09-01 VITALS — BP 138/91 | HR 78 | Temp 98.4°F | Resp 16 | Wt 224.6 lb

## 2019-09-01 DIAGNOSIS — E6609 Other obesity due to excess calories: Secondary | ICD-10-CM

## 2019-09-01 DIAGNOSIS — I10 Essential (primary) hypertension: Secondary | ICD-10-CM

## 2019-09-01 DIAGNOSIS — Z23 Encounter for immunization: Secondary | ICD-10-CM

## 2019-09-01 DIAGNOSIS — Z6832 Body mass index (BMI) 32.0-32.9, adult: Secondary | ICD-10-CM

## 2019-09-01 DIAGNOSIS — R009 Unspecified abnormalities of heart beat: Secondary | ICD-10-CM

## 2019-09-01 DIAGNOSIS — R7303 Prediabetes: Secondary | ICD-10-CM

## 2019-09-01 DIAGNOSIS — E119 Type 2 diabetes mellitus without complications: Secondary | ICD-10-CM

## 2019-09-01 LAB — POCT GLYCOSYLATED HEMOGLOBIN (HGB A1C)
Est. average glucose Bld gHb Est-mCnc: 137
Hemoglobin A1C: 6.4 % — AB (ref 4.0–5.6)

## 2019-09-01 MED ORDER — METFORMIN HCL 500 MG PO TABS: ORAL_TABLET | ORAL | 1 refills | Status: DC

## 2019-09-01 NOTE — Progress Notes (Signed)
Established patient visit   Patient: Francis Gomez   DOB: Feb 06, 1960   59 y.o. Male  MRN: [id number] Visit Date: 09/01/2019  Today's healthcare provider: Margaretann Loveless, PA-C   Chief Complaint  Patient presents with  . Follow-up   Subjective    HPI  Hypertension, follow-up  BP Readings from Last 3 Encounters:  09/01/19 (!) 138/91  04/29/19 (!) 156/104  02/11/19 (!) 165/99   Wt Readings from Last 3 Encounters:  09/01/19 224 lb 9.6 oz (101.9 kg)  04/29/19 232 lb (105.2 kg)  03/10/19 231 lb 14.4 oz (105.2 kg)     He was last seen for hypertension 4 months ago.  BP at that visit was 156/104. Management since that visit includes Continue Hyzaar 50-12.5 mg qd and Verapamil 120 mg 2 tablets daily. He will monitor BP at home and call report of readings in 3 days if still elevated at home. Encouraged to limit salt and caffeine intake.  He reports excellent compliance with treatment. He is not having side effects.  He is following a Regular diet. He is not exercising.  Outside blood pressures are 120's/108-103. Symptoms: No chest pain No chest pressure  No palpitations No syncope  No dyspnea No orthopnea  No paroxysmal nocturnal dyspnea No lower extremity edema   Pertinent labs: Lab Results  Component Value Date   CHOL 153 02/14/2019   HDL 34 (L) 02/14/2019   LDLCALC 70 02/14/2019   TRIG 305 (H) 02/14/2019   CHOLHDL 4.5 02/14/2019   Lab Results  Component Value Date   NA 141 02/14/2019   K 4.0 02/14/2019   CREATININE 1.31 (H) 02/14/2019   GFRNONAA 60 02/14/2019   GFRAA 69 02/14/2019   GLUCOSE 113 (H) 02/14/2019     The 10-year ASCVD risk score Denman George DC Jr., et al., 2013) is: 27.1%   --------------------------------------------------------------------------------------------------- Diabetes Mellitus Type II, Follow-up  Lab Results  Component Value Date   HGBA1C 6.4 (A) 09/01/2019   HGBA1C 6.8 (H) 02/14/2019   HGBA1C 6.4 (H) 03/26/2018   Wt  Readings from Last 3 Encounters:  09/01/19 224 lb 9.6 oz (101.9 kg)  04/29/19 232 lb (105.2 kg)  03/10/19 231 lb 14.4 oz (105.2 kg)   Last seen for diabetes 6 months ago.  Management since then includes Metformin 500 mg qd.increase water intake. Recommend diabetes education regarding control and diet with [facility]. Referral placed. He reports excellent compliance with treatment. He is not having side effects.  Symptoms: No fatigue No foot ulcerations  Yes appetite changes No nausea  No paresthesia of the feet  No polydipsia  No polyuria No visual disturbances   No vomiting     Home blood sugar records: not being checked  Episodes of hypoglycemia? No not being checked   Current insulin regiment: none Most Recent Eye Exam: doesn't have an eye doctor Current exercise: none unable due to arthritis Current diet habits: in general, an "unhealthy" diet  Pertinent Labs: Lab Results  Component Value Date   CHOL 153 02/14/2019   HDL 34 (L) 02/14/2019   LDLCALC 70 02/14/2019   TRIG 305 (H) 02/14/2019   CHOLHDL 4.5 02/14/2019   Lab Results  Component Value Date   NA 141 02/14/2019   K 4.0 02/14/2019   CREATININE 1.31 (H) 02/14/2019   GFRNONAA 60 02/14/2019   GFRAA 69 02/14/2019   GLUCOSE 113 (H) 02/14/2019     ---------------------------------------------------------------------------------------------------  Patient Active Problem List   Diagnosis Date Noted  . 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 Hypothyroidism 01/20/2017  . Lumbago with sciatica 06/09/2016  . Prediabetes 05/14/2015  . Hypertension 05/14/2015  . Bilateral chronic knee pain 02/15/2015  . Osteoarthritis of left knee 02/15/2015  . Patellofemoral stress syndrome of left knee 02/15/2015   History reviewed. No pertinent past medical history.     Medications: Outpatient Medications Prior to Visit  Medication Sig  . acetaminophen (TYLENOL) 325 MG tablet Take by mouth. Arthritis formula  .  atorvastatin (LIPITOR) 80 MG tablet TAKE 1 TABLET BY MOUTH EVERY DAY  . influenza vac split quadrivalent PF (FLUARIX) 0.5 ML injection Fluzone Quad 2018-19(PF) 60 mcg(15 mcgx4)/0.5 mL intramuscular syringe  TO BE ADMINISTERED BY PHARMACIST FOR IMMUNIZATION  . levothyroxine (SYNTHROID) 25 MCG tablet TAKE 1 TABLET (25 MCG TOTAL) BY MOUTH DAILY-- BEFORE BREAKFAST.  Marland Kitchen losartan-hydrochlorothiazide (HYZAAR) 50-12.5 MG tablet Take 1 tablet by mouth daily.  . meloxicam (MOBIC) 15 MG tablet meloxicam 15 mg tablet  TAKE 1 TABLET BY MOUTH EVERY DAY  . metFORMIN (GLUCOPHAGE) 500 MG tablet TAKE 1 TABLET BY MOUTH EVERY DAY WITH BREAKFAST  . predniSONE (DELTASONE) 10 MG tablet Taper down dosage by 1 tablet daily starting at 6 day 1, 5 day 2, 4 day 3, 3 day 4, 2 day 5 and 1 day 6 by mouth divided among meals and bedtime.  Marland Kitchen tiZANidine (ZANAFLEX) 4 MG capsule Take 1 capsule (4 mg total) by mouth 3 (three) times daily as needed for muscle spasms.  . verapamil (CALAN) 120 MG tablet TAKE 2 TABLETS BY MOUTH EVERY DAY   No facility-administered medications prior to visit.    Review of Systems  Constitutional: Positive for appetite change.  Eyes: Negative for visual disturbance.  Respiratory: Negative for cough, chest tightness, shortness of breath and wheezing.   Cardiovascular: Negative for chest pain, palpitations and leg swelling.  Gastrointestinal: Negative for abdominal pain and diarrhea.  Endocrine: Negative for polydipsia and polyuria.  Neurological: Negative for dizziness, light-headedness and headaches.    Last CBC Lab Results  Component Value Date   WBC 5.9 02/14/2019   HGB 15.6 02/14/2019   HCT 46.6 02/14/2019   MCV 82 02/14/2019   MCH 27.3 02/14/2019   RDW 14.3 02/14/2019   PLT 213 02/14/2019   Last metabolic panel Lab Results  Component Value Date   GLUCOSE 113 (H) 02/14/2019   NA 141 02/14/2019   K 4.0 02/14/2019   CL 103 02/14/2019   CO2 20 02/14/2019   BUN 9 02/14/2019    CREATININE 1.31 (H) 02/14/2019   GFRNONAA 60 02/14/2019   GFRAA 69 02/14/2019   CALCIUM 9.3 02/14/2019   PROT 7.5 02/14/2019   ALBUMIN 4.5 02/14/2019   LABGLOB 3.0 02/14/2019   AGRATIO 1.5 02/14/2019   BILITOT 0.5 02/14/2019   ALKPHOS 189 (H) 02/14/2019   AST 32 02/14/2019   ALT 54 (H) 02/14/2019      Objective    BP (!) 138/91 (BP Location: Left Arm, Patient Position: Sitting, Cuff Size: Large)   Pulse 78 Comment: manually 78, BP machine HR 43 pulse ox 85  Temp 98.4 F (36.9 C) (Oral)   Resp 16   Wt 224 lb 9.6 oz (101.9 kg)   SpO2 96%   BMI 32.23 kg/m  BP Readings from Last 3 Encounters:  09/01/19 (!) 138/91  04/29/19 (!) 156/104  02/11/19 (!) 165/99   Wt Readings from Last 3 Encounters:  09/01/19 224 lb 9.6 oz (101.9 kg)  04/29/19 232 lb (105.2 kg)  03/10/19 231 lb  14.4 oz (105.2 kg)      Physical Exam Vitals reviewed.  Constitutional:      General: He is not in acute distress.    Appearance: Normal appearance. He is well-developed. He is obese. He is not ill-appearing or diaphoretic.  HENT:     Head: Normocephalic and atraumatic.  Eyes:     General: No scleral icterus. Cardiovascular:     Rate and Rhythm: Normal rate and regular rhythm.     Pulses: Normal pulses.     Heart sounds: Normal heart sounds. No murmur heard.  No friction rub. No gallop.   Pulmonary:     Effort: Pulmonary effort is normal. No respiratory distress.     Breath sounds: Normal breath sounds. No wheezing or rales.  Musculoskeletal:     Cervical back: Normal range of motion and neck supple.  Skin:    General: Skin is warm and dry.  Neurological:     Mental Status: He is alert.      Results for orders placed or performed in visit on 09/01/19  POCT glycosylated hemoglobin (Hb A1C)  Result Value Ref Range   Hemoglobin A1C 6.4 (A) 4.0 - 5.6 %   Est. average glucose Bld gHb Est-mCnc 137     Assessment & Plan      1. Diabetes mellitus without complication (HCC) A1c improved  from 6.8 to 6.4. Patient has also lost about 10 pounds. Continue metformin 500mg  daily. Return in 6 months for CPE.  - metFORMIN (GLUCOPHAGE) 500 MG tablet; TAKE 1 TABLET BY MOUTH EVERY DAY WITH BREAKFAST  Dispense: 90 tablet; Refill: 1  2. Unspecified abnormalities of heart beat EKG shows NSR rate of 77. No ST segment changes. BP machine had read HR low in the 40s and the pulse ox was reading it in the 80s. EKG and manual check were consistent with HR in the upper 70s.  - EKG 12-Lead  3. Essential hypertension Improved. Continue Losartan-HCTZ 50-12.5mg  and verapamil 120mg  BID.   4. Class 1 obesity due to excess calories with serious comorbidity and body mass index (BMI) of 32.0 to 32.9 in adult Has lost 10 pounds since last visit. Counseled patient on healthy lifestyle modifications including dieting and exercise.   5. Need for influenza vaccination Flu vaccine given today without complication. Patient sat upright for 15 minutes to check for adverse reaction before being released. - Flu Vaccine QUAD 6+ mos PF IM (Fluarix Quad PF)  No follow-ups on file.      , PA-C, have reviewed all documentation for this visit. The documentation on 09/01/19 for the exam, diagnosis, procedures, and orders are all accurate and complete.   Delmer Islam  [facility] [phone] (phone) [phone] (fax)  [facility]

## 2019-09-01 NOTE — Patient Instructions (Signed)

## 2020-01-29 ENCOUNTER — Other Ambulatory Visit: Payer: Self-pay | Admitting: Family Medicine

## 2020-01-29 DIAGNOSIS — I10 Essential (primary) hypertension: Secondary | ICD-10-CM

## 2020-01-31 ENCOUNTER — Other Ambulatory Visit: Payer: Self-pay | Admitting: Family Medicine

## 2020-01-31 DIAGNOSIS — I10 Essential (primary) hypertension: Secondary | ICD-10-CM

## 2020-02-22 ENCOUNTER — Other Ambulatory Visit: Payer: Self-pay | Admitting: Family Medicine

## 2020-02-22 DIAGNOSIS — I10 Essential (primary) hypertension: Secondary | ICD-10-CM

## 2020-02-28 ENCOUNTER — Ambulatory Visit: Payer: Self-pay | Admitting: Family Medicine

## 2020-04-02 ENCOUNTER — Other Ambulatory Visit: Payer: Self-pay | Admitting: Family Medicine

## 2020-04-02 NOTE — Telephone Encounter (Signed)
Requested Prescriptions  Pending Prescriptions Disp Refills  . atorvastatin (LIPITOR) 80 MG tablet [Pharmacy Med Name: ATORVASTATIN 80 MG TABLET] 90 tablet 3    Sig: TAKE 1 TABLET BY MOUTH EVERY DAY     Cardiovascular:  Antilipid - Statins Failed - 04/02/2020  1:20 AM      Failed - Total Cholesterol in normal range and within 360 days    Cholesterol, Total  Date Value Ref Range Status  02/14/2019 153 100 - 199 mg/dL Final         Failed - LDL in normal range and within 360 days    LDL Chol Calc (NIH)  Date Value Ref Range Status  02/14/2019 70 0 - 99 mg/dL Final         Failed - HDL in normal range and within 360 days    HDL  Date Value Ref Range Status  02/14/2019 34 (L) >39 mg/dL Final         Failed - Triglycerides in normal range and within 360 days    Triglycerides  Date Value Ref Range Status  02/14/2019 305 (H) 0 - 149 mg/dL Final         Passed - Patient is not pregnant      Passed - Valid encounter within last 12 months    Recent Outpatient Visits          7 months ago Prediabetes   [facility] [place], [place], [place]   11 months ago Cervical paraspinous muscle spasm   [facility], Jodell Cipro, PA-C   1 year ago Essential hypertension   [facility] [facility], Jodell Cipro, PA-C   2 years ago Prediabetes   [facility] [facility], Jodell Cipro, PA-C   3 years ago Annual physical exam   [facility] [facility], Jodell Cipro, PA-C      Future Appointments            In 1 week Chrismon, Jodell Cipro, PA-C [facility]

## 2020-04-12 ENCOUNTER — Ambulatory Visit: Payer: Self-pay | Admitting: Family Medicine

## 2020-04-12 ENCOUNTER — Other Ambulatory Visit: Payer: Self-pay | Admitting: Family Medicine

## 2020-04-17 ENCOUNTER — Encounter: Payer: Self-pay | Admitting: Family Medicine

## 2020-04-17 ENCOUNTER — Other Ambulatory Visit: Payer: Self-pay

## 2020-04-17 ENCOUNTER — Ambulatory Visit: Payer: 59 | Admitting: Family Medicine

## 2020-04-17 VITALS — BP 126/86 | HR 84 | Temp 98.6°F | Wt 226.0 lb

## 2020-04-17 DIAGNOSIS — I10 Essential (primary) hypertension: Secondary | ICD-10-CM | POA: Diagnosis not present

## 2020-04-17 DIAGNOSIS — E6609 Other obesity due to excess calories: Secondary | ICD-10-CM

## 2020-04-17 DIAGNOSIS — E119 Type 2 diabetes mellitus without complications: Secondary | ICD-10-CM | POA: Diagnosis not present

## 2020-04-17 DIAGNOSIS — Z6832 Body mass index (BMI) 32.0-32.9, adult: Secondary | ICD-10-CM

## 2020-04-17 DIAGNOSIS — E039 Hypothyroidism, unspecified: Secondary | ICD-10-CM | POA: Diagnosis not present

## 2020-04-17 DIAGNOSIS — M1712 Unilateral primary osteoarthritis, left knee: Secondary | ICD-10-CM

## 2020-04-17 DIAGNOSIS — Z1211 Encounter for screening for malignant neoplasm of colon: Secondary | ICD-10-CM

## 2020-04-17 LAB — POCT GLYCOSYLATED HEMOGLOBIN (HGB A1C): Hemoglobin A1C: 6.4 % — AB (ref 4.0–5.6)

## 2020-04-17 NOTE — Progress Notes (Signed)
Established patient visit   Patient: Francis Gomez   DOB: 08/30/1960   60 y.o. Male  MRN: [id number] Visit Date: 04/17/2020  Today's healthcare provider: Azalee Course Wanya Bangura, FNP   Chief Complaint  Patient presents with  . Hypertension  . Hypothyroidism  . Diabetes   Subjective    HPI  Diabetes Mellitus Type II, follow-up  Lab Results  Component Value Date   HGBA1C 6.4 (A) 04/17/2020   HGBA1C 6.4 (A) 09/01/2019   HGBA1C 6.8 (H) 02/14/2019   Last seen for diabetes 7 months ago.  Management since then includes continuing the same treatment. He reports excellent compliance with treatment. He is not having side effects.  Last eye exam in January: was told everything looks good Does not see podiatry for foot exams Denies numbness and tingling  Home blood sugar records: are not being checked.  Episodes of hypoglycemia? No    Current insulin regiment: None Most Recent Eye Exam: UTD  01/2020  --------------------------------------------------------------------------------------------------- Hypertension, follow-up  BP Readings from Last 3 Encounters:  04/17/20 126/86  09/01/19 (!) 138/91  04/29/19 (!) 156/104   Wt Readings from Last 3 Encounters:  04/17/20 226 lb (102.5 kg)  09/01/19 224 lb 9.6 oz (101.9 kg)  04/29/19 232 lb (105.2 kg)     He was last seen for hypertension 7 months ago.  He reports excellent compliance with treatment. He is not having side effects.  He is exercising. He is adherent to low salt diet.   Outside blood pressures are normal at home.  He does not smoke.  Use of agents associated with hypertension: none.   ---------------------------------------------------------------------------------------------------  Symptoms: No appetite changes No foot ulcerations  No chest pain No chest pressure/discomfort  No dyspnea No orthopnea  No fatigue No lower extremity edema  No palpitations No paroxysmal nocturnal dyspnea  No nausea No  numbness or tingling of extremity  No polydipsia No polyuria  No speech difficulty No syncope   Last metabolic panel Lab Results  Component Value Date   GLUCOSE 113 (H) 02/14/2019   NA 141 02/14/2019   K 4.0 02/14/2019   BUN 9 02/14/2019   CREATININE 1.31 (H) 02/14/2019   GFRNONAA 60 02/14/2019   GFRAA 69 02/14/2019   CALCIUM 9.3 02/14/2019   AST 32 02/14/2019   ALT 54 (H) 02/14/2019   The 10-year ASCVD risk score Denman George DC Jr., et al., 2013) is: 23.3%  ---------------------------------------------------------------------------------------------------   Patient Active Problem List   Diagnosis Date Noted  . 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 Hypothyroidism 01/20/2017  . Lumbago with sciatica 06/09/2016  . Prediabetes 05/14/2015  . Hypertension 05/14/2015  . Bilateral chronic knee pain 02/15/2015  . Osteoarthritis of left knee 02/15/2015  . Patellofemoral stress syndrome of left knee 02/15/2015   History reviewed. No pertinent past medical history. Social History   Tobacco Use  . Smoking status: Never Smoker  . Smokeless tobacco: Never Used  Substance Use Topics  . Alcohol use: No  . Drug use: No   Allergies  Allergen Reactions  . Amlodipine Itching     Medications: Outpatient Medications Prior to Visit  Medication Sig  . acetaminophen (TYLENOL) 325 MG tablet Take by mouth. Arthritis formula  . atorvastatin (LIPITOR) 80 MG tablet TAKE 1 TABLET BY MOUTH EVERY DAY  . levothyroxine (SYNTHROID) 25 MCG tablet TAKE 1 TABLET (25 MCG TOTAL) BY MOUTH DAILY-- BEFORE BREAKFAST.  Marland Kitchen losartan-hydrochlorothiazide (HYZAAR) 50-12.5 MG tablet TAKE 1 TABLET BY MOUTH EVERY DAY  . meloxicam (MOBIC)  15 MG tablet meloxicam 15 mg tablet  TAKE 1 TABLET BY MOUTH EVERY DAY  . metFORMIN (GLUCOPHAGE) 500 MG tablet TAKE 1 TABLET BY MOUTH EVERY DAY WITH BREAKFAST  . tiZANidine (ZANAFLEX) 4 MG capsule Take 1 capsule (4 mg total) by mouth 3 (three) times daily as needed for muscle spasms.   . verapamil (CALAN) 120 MG tablet TAKE 2 TABLETS BY MOUTH EVERY DAY  . influenza vac split quadrivalent PF (FLUARIX) 0.5 ML injection Fluzone Quad 2018-19(PF) 60 mcg(15 mcgx4)/0.5 mL intramuscular syringe  TO BE ADMINISTERED BY PHARMACIST FOR IMMUNIZATION   No facility-administered medications prior to visit.    Review of Systems  Constitutional: Negative.   Respiratory: Negative.   Cardiovascular: Negative.   Gastrointestinal: Negative.   Musculoskeletal: Positive for arthralgias (bilateral knees and right shoulder, takes BC powder for this approx 2x/week).  Skin: Negative for wound.  Neurological: Negative for dizziness, light-headedness, numbness and headaches.        Objective    BP 126/86 (BP Location: Left Arm, Patient Position: Sitting, Cuff Size: Large)   Pulse 84   Temp 98.6 F (37 C) (Oral)   Wt 226 lb (102.5 kg)   BMI 32.43 kg/m    Physical Exam Vitals reviewed.  Constitutional:      Appearance: Normal appearance.  HENT:     Head: Normocephalic and atraumatic.  Eyes:     Conjunctiva/sclera: Conjunctivae normal.  Cardiovascular:     Rate and Rhythm: Normal rate and regular rhythm.     Pulses: Normal pulses.     Heart sounds: Normal heart sounds. No murmur heard. No friction rub. No gallop.   Pulmonary:     Effort: Pulmonary effort is normal. No respiratory distress.     Breath sounds: Normal breath sounds. No stridor. No wheezing or rales.  Abdominal:     General: Bowel sounds are normal.     Palpations: Abdomen is soft.     Tenderness: There is no abdominal tenderness.  Musculoskeletal:     Right lower leg: No edema.     Left lower leg: No edema.  Skin:    General: Skin is warm and dry.  Neurological:     General: No focal deficit present.     Mental Status: He is alert and oriented to person, place, and time.  Psychiatric:        Mood and Affect: Mood normal.        Behavior: Behavior normal.      Results for orders placed or performed in  visit on 04/17/20  POCT glycosylated hemoglobin (Hb A1C)  Result Value Ref Range   Hemoglobin A1C 6.4 (A) 4.0 - 5.6 %    Assessment & Plan     Problem List Items Addressed This Visit      Cardiovascular and Mediastinum   Hypertension - Primary   Relevant Orders   Comprehensive metabolic panel     Endocrine   Hypothyroidism   Relevant Orders   TSH     Musculoskeletal and Integument   Osteoarthritis of left knee    Other Visit Diagnoses    Diabetes mellitus without complication (HCC)       Relevant Orders   POCT glycosylated hemoglobin (Hb A1C) (Completed)   Lipid Panel   Class 1 obesity due to excess calories with serious comorbidity and body mass index (BMI) of 32.0 to 32.9 in adult       Special screening for malignant neoplasms, colon       Relevant  Orders   Ambulatory referral to Gastroenterology     Plan  BP at goal< 130/80 no medication changes needed  A1c at goal<7 no medication changes needed  Continue to work on LFM, weight stable, BMI 32  Referral placed to GI for repeat colonoscopy, polyps found in 2015  Will follow up with lab results  Discussed progressive nature of OA and treatment plan   Return in about 6 months (around 10/17/2020).      Azalee Course Ryonna Cimini, FNP  [facility] [phone] (phone) [phone] (fax)  [facility]

## 2020-04-17 NOTE — Patient Instructions (Signed)

## 2020-04-18 LAB — LIPID PANEL
Chol/HDL Ratio: 4.3 ratio (ref 0.0–5.0)
Cholesterol, Total: 143 mg/dL (ref 100–199)
HDL: 33 mg/dL — ABNORMAL LOW (ref >39)
LDL Chol Calc (NIH): 55 mg/dL (ref 0–99)
Triglycerides: 359 mg/dL — ABNORMAL HIGH (ref 0–149)
VLDL Cholesterol Cal: 55 mg/dL — ABNORMAL HIGH (ref 5–40)

## 2020-04-18 LAB — TSH: TSH: 3.45 u[IU]/mL (ref 0.450–4.500)

## 2020-04-19 NOTE — Progress Notes (Signed)
TSH within normal limits. No medication changes needed. Cholesterol levels look good. A1c is stable no medication changes needed to your metformin.

## 2020-04-26 ENCOUNTER — Other Ambulatory Visit: Payer: Self-pay

## 2020-04-26 ENCOUNTER — Telehealth (INDEPENDENT_AMBULATORY_CARE_PROVIDER_SITE_OTHER): Payer: Self-pay | Admitting: Gastroenterology

## 2020-04-26 DIAGNOSIS — Z1211 Encounter for screening for malignant neoplasm of colon: Secondary | ICD-10-CM

## 2020-04-26 MED ORDER — NA SULFATE-K SULFATE-MG SULF 17.5-3.13-1.6 GM/177ML PO SOLN: 1.0000 | Freq: Once | ORAL | 0 refills | Status: AC

## 2020-04-26 NOTE — Progress Notes (Signed)
Gastroenterology Pre-Procedure Review  Request Date: Friday 05/18/20 Requesting Physician: Dr. Maximino Greenland  PATIENT REVIEW QUESTIONS: The patient responded to the following health history questions as indicated:    1. Are you having any GI issues? no 2. Do you have a personal history of Polyps? no 3. Do you have a family history of Colon Cancer or Polyps? no 4. Diabetes Mellitus? no 5. Joint replacements in the past 12 months?no 6. Major health problems in the past 3 months?no 7. Any artificial heart valves, MVP, or defibrillator?no    MEDICATIONS & ALLERGIES:    Patient reports the following regarding taking any anticoagulation/antiplatelet therapy:   Plavix, Coumadin, Eliquis, Xarelto, Lovenox, Pradaxa, Brilinta, or Effient? no Aspirin? no  Patient confirms/reports the following medications:  Current Outpatient Medications  Medication Sig Dispense Refill  . acetaminophen (TYLENOL) 325 MG tablet Take by mouth. Arthritis formula    . atorvastatin (LIPITOR) 80 MG tablet TAKE 1 TABLET BY MOUTH EVERY DAY 90 tablet 3  . levothyroxine (SYNTHROID) 25 MCG tablet TAKE 1 TABLET (25 MCG TOTAL) BY MOUTH DAILY-- BEFORE BREAKFAST. 30 tablet 0  . losartan-hydrochlorothiazide (HYZAAR) 50-12.5 MG tablet TAKE 1 TABLET BY MOUTH EVERY DAY 90 tablet 3  . meloxicam (MOBIC) 15 MG tablet meloxicam 15 mg tablet  TAKE 1 TABLET BY MOUTH EVERY DAY    . metFORMIN (GLUCOPHAGE) 500 MG tablet TAKE 1 TABLET BY MOUTH EVERY DAY WITH BREAKFAST 90 tablet 1  . tiZANidine (ZANAFLEX) 4 MG capsule Take 1 capsule (4 mg total) by mouth 3 (three) times daily as needed for muscle spasms. 21 capsule 0  . verapamil (CALAN) 120 MG tablet TAKE 2 TABLETS BY MOUTH EVERY DAY 180 tablet 0   No current facility-administered medications for this visit.    Patient confirms/reports the following allergies:  Allergies  Allergen Reactions  . Amlodipine Itching    No orders of the defined types were placed in this  encounter.   AUTHORIZATION INFORMATION Primary Insurance: 1D#: Group #:  Secondary Insurance: 1D#: Group #:  SCHEDULE INFORMATION: Date: Friday 05/18/20 Time: Location:[facility]

## 2020-05-09 ENCOUNTER — Other Ambulatory Visit: Payer: Self-pay | Admitting: Family Medicine

## 2020-05-09 NOTE — Telephone Encounter (Signed)
Requested Prescriptions  Pending Prescriptions Disp Refills  . levothyroxine (SYNTHROID) 25 MCG tablet [Pharmacy Med Name: LEVOTHYROXINE 25 MCG TABLET] 90 tablet 3    Sig: TAKE 1 TABLET (25 MCG TOTAL) BY MOUTH DAILY-- BEFORE BREAKFAST.     Endocrinology:  Hypothyroid Agents Failed - 05/09/2020  1:38 AM      Failed - TSH needs to be rechecked within 3 months after an abnormal result. Refill until TSH is due.      Passed - TSH in normal range and within 360 days    TSH  Date Value Ref Range Status  04/17/2020 3.450 0.450 - 4.500 uIU/mL Final         Passed - Valid encounter within last 12 months    Recent Outpatient Visits          3 weeks ago Primary hypertension   [facility], Azalee Course, FNP   8 months ago Prediabetes   [facility] [place], [place], [place]   1 year ago Cervical paraspinous muscle spasm   [facility] [facility], Jodell Cipro, PA-C   1 year ago Essential hypertension   [employer] [facility], Jodell Cipro, PA-C   2 years ago Prediabetes   [facility], Jodell Cipro, PA-C      Future Appointments            In 5 months Chrismon, Jodell Cipro, PA-C [facility]

## 2020-05-11 ENCOUNTER — Other Ambulatory Visit: Payer: Self-pay | Admitting: Physician Assistant

## 2020-05-11 DIAGNOSIS — R7303 Prediabetes: Secondary | ICD-10-CM

## 2020-05-11 DIAGNOSIS — E119 Type 2 diabetes mellitus without complications: Secondary | ICD-10-CM

## 2020-05-19 ENCOUNTER — Other Ambulatory Visit: Payer: Self-pay | Admitting: Family Medicine

## 2020-05-19 DIAGNOSIS — I10 Essential (primary) hypertension: Secondary | ICD-10-CM

## 2020-05-24 ENCOUNTER — Other Ambulatory Visit: Payer: Self-pay

## 2020-05-24 MED ORDER — NA SULFATE-K SULFATE-MG SULF 17.5-3.13-1.6 GM/177ML PO SOLN: 1.0000 | Freq: Once | ORAL | 0 refills | Status: AC

## 2020-05-24 MED ORDER — NA SULFATE-K SULFATE-MG SULF 17.5-3.13-1.6 GM/177ML PO SOLN: 1.0000 | Freq: Once | ORAL | 0 refills | Status: DC

## 2020-05-25 ENCOUNTER — Encounter
Admission: RE | Disposition: A | Discharge status: Home / Self Care | Payer: Self-pay | Source: Home / Self Care | Attending: Gastroenterology

## 2020-05-25 ENCOUNTER — Ambulatory Visit
Admission: RE | Admit: 2020-05-25 | Discharge: 2020-05-25 | Disposition: A | Discharge status: Home / Self Care | Payer: 59 | Attending: Gastroenterology | Admitting: Gastroenterology

## 2020-05-25 ENCOUNTER — Ambulatory Visit: Payer: 59 | Admitting: Anesthesiology

## 2020-05-25 ENCOUNTER — Encounter: Payer: Self-pay | Admitting: Gastroenterology

## 2020-05-25 DIAGNOSIS — Z7984 Long term (current) use of oral hypoglycemic drugs: Secondary | ICD-10-CM | POA: Insufficient documentation

## 2020-05-25 DIAGNOSIS — Z1211 Encounter for screening for malignant neoplasm of colon: Secondary | ICD-10-CM | POA: Insufficient documentation

## 2020-05-25 DIAGNOSIS — Z79899 Other long term (current) drug therapy: Secondary | ICD-10-CM | POA: Diagnosis not present

## 2020-05-25 DIAGNOSIS — Z888 Allergy status to other drugs, medicaments and biological substances status: Secondary | ICD-10-CM | POA: Diagnosis not present

## 2020-05-25 DIAGNOSIS — K6289 Other specified diseases of anus and rectum: Secondary | ICD-10-CM | POA: Insufficient documentation

## 2020-05-25 DIAGNOSIS — K635 Polyp of colon: Secondary | ICD-10-CM | POA: Insufficient documentation

## 2020-05-25 DIAGNOSIS — Z791 Long term (current) use of non-steroidal anti-inflammatories (NSAID): Secondary | ICD-10-CM | POA: Diagnosis not present

## 2020-05-25 DIAGNOSIS — Z79890 Hormone replacement therapy (postmenopausal): Secondary | ICD-10-CM | POA: Diagnosis not present

## 2020-05-25 HISTORY — PX: COLONOSCOPY WITH PROPOFOL: SHX5780

## 2020-05-25 HISTORY — DX: Hypothyroidism, unspecified: E03.9

## 2020-05-25 HISTORY — DX: Unspecified osteoarthritis, unspecified site: M19.90

## 2020-05-25 HISTORY — DX: Other muscle spasm: M62.838

## 2020-05-25 HISTORY — DX: Essential (primary) hypertension: I10

## 2020-05-25 HISTORY — DX: Obesity, unspecified: E66.9

## 2020-05-25 HISTORY — DX: Type 2 diabetes mellitus without complications: E11.9

## 2020-05-25 LAB — GLUCOSE, CAPILLARY: Glucose-Capillary: 103 mg/dL — ABNORMAL HIGH (ref 70–99)

## 2020-05-25 SURGERY — COLONOSCOPY WITH PROPOFOL
Anesthesia: General

## 2020-05-25 MED ORDER — GLYCOPYRROLATE 0.2 MG/ML IJ SOLN
INTRAMUSCULAR | Status: DC | PRN
  Administered 2020-05-25: .2 mg via INTRAVENOUS

## 2020-05-25 MED ORDER — PROPOFOL 500 MG/50ML IV EMUL
INTRAVENOUS | Status: DC | PRN
  Administered 2020-05-25: 175 ug/kg/min via INTRAVENOUS

## 2020-05-25 MED ORDER — LIDOCAINE HCL (PF) 2 % IJ SOLN
INTRAMUSCULAR | Status: AC
  Filled 2020-05-25: qty 2

## 2020-05-25 MED ORDER — LIDOCAINE HCL (CARDIAC) PF 100 MG/5ML IV SOSY
PREFILLED_SYRINGE | INTRAVENOUS | Status: DC | PRN
  Administered 2020-05-25: 40 mg via INTRAVENOUS

## 2020-05-25 MED ORDER — PROPOFOL 10 MG/ML IV BOLUS
INTRAVENOUS | Status: DC | PRN
  Administered 2020-05-25: 20 mg via INTRAVENOUS
  Administered 2020-05-25: 80 mg via INTRAVENOUS

## 2020-05-25 MED ORDER — ONDANSETRON HCL 4 MG/2ML IJ SOLN
INTRAMUSCULAR | Status: DC | PRN
  Administered 2020-05-25: 4 mg via INTRAVENOUS

## 2020-05-25 MED ORDER — SODIUM CHLORIDE 0.9 % IV SOLN
INTRAVENOUS | Status: DC
  Administered 2020-05-25: 20 mL/h via INTRAVENOUS

## 2020-05-25 MED ORDER — PROPOFOL 500 MG/50ML IV EMUL
INTRAVENOUS | Status: AC
  Filled 2020-05-25: qty 50

## 2020-05-25 NOTE — Op Note (Signed)
[facility] Patient Name: Francis Gomez Procedure Date: 05/25/2020 8:20 AM MRN: [id number] Account #: [id number] Date of Birth: 08-28-1960 Admit Type: Outpatient Age: 60 Room: [facility] ENDO ROOM 2 Gender: Male Note Status: Finalized Procedure:             Colonoscopy Indications:           Screening for colorectal malignant neoplasm Providers:             Danyal Adorno B. Maximino Greenland MD, MD Referring MD:          Jodell Cipro. Chrismon, MD (Referring MD) Medicines:             Monitored Anesthesia Care Complications:         No immediate complications. Procedure:             Pre-Anesthesia Assessment:                        - ASA Grade Assessment: II - A patient with mild                         systemic disease.                        - Prior to the procedure, a History and Physical was                         performed, and patient medications, allergies and                         sensitivities were reviewed. The patient's tolerance                         of previous anesthesia was reviewed.                        - The risks and benefits of the procedure and the                         sedation options and risks were discussed with the                         patient. All questions were answered and informed                         consent was obtained.                        - Patient identification and proposed procedure were                         verified prior to the procedure by the physician, the                         nurse, the anesthesiologist, the anesthetist and the                         technician. The procedure was verified in the                         procedure  room.                        After obtaining informed consent, the colonoscope was                         passed under direct vision. Throughout the procedure,                         the patient's blood pressure, pulse, and oxygen                         saturations were  monitored continuously. The                         Colonoscope was introduced through the anus and                         advanced to the the cecum, identified by appendiceal                         orifice and ileocecal valve. The colonoscopy was                         performed with ease. The patient tolerated the                         procedure well. The quality of the bowel preparation                         was fair. Some pieces of stool were large and were                         only partially cleared, especially in the right colon. Findings:      The perianal and digital rectal examinations were normal.      A patchy area of mildly erythematous mucosa was found in the sigmoid       colon. Biopsies were taken with a cold forceps for histology.      A 5 mm polyp was found in the sigmoid colon. The polyp was sessile. The       polyp was removed with a cold biopsy forceps. Resection and retrieval       were complete.      The exam was otherwise without abnormality.      The rectum, sigmoid colon, descending colon, transverse colon, ascending       colon and cecum appeared normal.      Anal papilla(e) were hypertrophied.      No additional abnormalities were found on retroflexion. Impression:            - Preparation of the colon was fair.                        - Erythematous mucosa in the sigmoid colon. Biopsied.                        - One 5 mm polyp in the sigmoid colon, removed with a  cold biopsy forceps. Resected and retrieved.                        - The examination was otherwise normal.                        - The rectum, sigmoid colon, descending colon,                         transverse colon, ascending colon and cecum are normal.                        - Anal papilla(e) were hypertrophied. Recommendation:        - Await pathology results.                        - Discharge patient to home (with escort).                        - Advance diet  as tolerated.                        - Continue present medications.                        - Repeat colonoscopy in 1 year, with 2 day prep.                        - The findings and recommendations were discussed with                         the patient.                        - The findings and recommendations were discussed with                         the patient's family.                        - Return to primary care physician as previously                         scheduled. Procedure Code(s):     --- Professional ---                        [phone], Colonoscopy, flexible; with biopsy, single or                         multiple Diagnosis Code(s):     --- Professional ---                        Z12.11, Encounter for screening for malignant neoplasm                         of colon                        K63.5, Polyp of colon CPT copyright 2019 American Medical Association. All rights reserved. The codes documented in this report are preliminary  and upon coder review may  be revised to meet current compliance requirements.  Melodie Bouillon, MD Michel Bickers B. Maximino Greenland MD, MD 05/25/2020 9:17:23 AM This report has been signed electronically. Number of Addenda: 0 Note Initiated On: 05/25/2020 8:20 AM Scope Withdrawal Time: 0 hours 7 minutes 53 seconds  Total Procedure Duration: 0 hours 29 minutes 41 seconds  Estimated Blood Loss:  Estimated blood loss: none.      [facility]

## 2020-05-25 NOTE — Anesthesia Postprocedure Evaluation (Signed)
Anesthesia Post Note  Patient: Francis Gomez  Procedure(s) Performed: COLONOSCOPY WITH PROPOFOL (N/A )  Patient location during evaluation: Endoscopy Anesthesia Type: General Level of consciousness: awake and alert and oriented Pain management: pain level controlled Vital Signs Assessment: post-procedure vital signs reviewed and stable Respiratory status: spontaneous breathing Cardiovascular status: blood pressure returned to baseline Anesthetic complications: no   No complications documented.   Last Vitals:  Vitals:   05/25/20 0914 05/25/20 0921  BP: (!) 149/96   Pulse: 97 95  Resp: (!) 21 14  Temp: (!) 35.6 C   SpO2: 98% 96%    Last Pain:  Vitals:   05/25/20 0944  TempSrc:   PainSc: 0-No pain                 Jewelz Kobus

## 2020-05-25 NOTE — Anesthesia Preprocedure Evaluation (Signed)
Anesthesia Evaluation  Patient identified by MRN, date of birth, ID band Patient awake    Reviewed: Allergy & Precautions, NPO status , Patient's Chart, lab work & pertinent test results  Airway Mallampati: II  TM Distance: >3 FB     Dental  (+) Teeth Intact   Pulmonary neg pulmonary ROS,    Pulmonary exam normal        Cardiovascular hypertension, Normal cardiovascular exam     Neuro/Psych negative neurological ROS  negative psych ROS   GI/Hepatic negative GI ROS, Neg liver ROS,   Endo/Other  diabetesHypothyroidism   Renal/GU negative Renal ROS  negative genitourinary   Musculoskeletal  (+) Arthritis ,   Abdominal Normal abdominal exam  (+)   Peds negative pediatric ROS (+)  Hematology negative hematology ROS (+)   Anesthesia Other Findings   Reproductive/Obstetrics                             Anesthesia Physical Anesthesia Plan  ASA: III  Anesthesia Plan: General   Post-op Pain Management:    Induction: Intravenous  PONV Risk Score and Plan: Propofol infusion  Airway Management Planned: Nasal Cannula  Additional Equipment:   Intra-op Plan:   Post-operative Plan:   Informed Consent: I have reviewed the patients History and Physical, chart, labs and discussed the procedure including the risks, benefits and alternatives for the proposed anesthesia with the patient or authorized representative who has indicated his/her understanding and acceptance.     Dental advisory given  Plan Discussed with: CRNA  Anesthesia Plan Comments:         Anesthesia Quick Evaluation

## 2020-05-25 NOTE — H&P (Signed)
Melodie Bouillon, MD [address], [place], [place], [address] [address], [address], [place], [place], [address] Phone: [phone]  Fax: [phone]  Primary Care Physician:  Tamsen Roers, PA-C   Pre-Procedure History & Physical: HPI:  Dannie Hattabaugh is a 60 y.o. male is here for a colonoscopy.   History reviewed. No pertinent past medical history.  Past Surgical History:  Procedure Laterality Date  . KNEE SURGERY Left    x's 2    Prior to Admission medications   Medication Sig Start Date End Date Taking? Authorizing Provider  acetaminophen (TYLENOL) 325 MG tablet Take by mouth. Arthritis formula   Yes [provider]  atorvastatin (LIPITOR) 80 MG tablet TAKE 1 TABLET BY MOUTH EVERY DAY 04/02/20  Yes Chrismon, Jodell Cipro, PA-C  levothyroxine (SYNTHROID) 25 MCG tablet TAKE 1 TABLET (25 MCG TOTAL) BY MOUTH DAILY-- BEFORE BREAKFAST. 05/09/20  Yes Chrismon, Jodell Cipro, PA-C  losartan-hydrochlorothiazide (HYZAAR) 50-12.5 MG tablet TAKE 1 TABLET BY MOUTH EVERY DAY 01/30/20  Yes Chrismon, Jodell Cipro, PA-C  meloxicam (MOBIC) 15 MG tablet meloxicam 15 mg tablet  TAKE 1 TABLET BY MOUTH EVERY DAY   Yes [provider]  metFORMIN (GLUCOPHAGE) 500 MG tablet TAKE 1 TABLET BY MOUTH EVERY DAY WITH BREAKFAST 05/11/20  Yes Chrismon, Maurine Minister E, PA-C  tiZANidine (ZANAFLEX) 4 MG capsule Take 1 capsule (4 mg total) by mouth 3 (three) times daily as needed for muscle spasms. 04/29/19  Yes Chrismon, Jodell Cipro, PA-C  verapamil (CALAN) 120 MG tablet TAKE 2 TABLETS BY MOUTH EVERY DAY 05/19/20  Yes Chrismon, Jodell Cipro, PA-C    Allergies as of 04/26/2020 - Review Complete 04/26/2020  Allergen Reaction Noted  . Amlodipine Itching 02/12/2015    Family History  Problem Relation Age of Onset  . Diabetes Mother   . Heart disease Father 37       MI  . Heart disease Sister     Social History   Socioeconomic History  . Marital status: Single    Spouse name: Not on file  .  Number of children: Not on file  . Years of education: Not on file  . Highest education level: Not on file  Occupational History  . Not on file  Tobacco Use  . Smoking status: Never Smoker  . Smokeless tobacco: Never Used  Substance and Sexual Activity  . Alcohol use: No  . Drug use: No  . Sexual activity: Not on file  Other Topics Concern  . Not on file  Social History Narrative  . Not on file   Social Determinants of Health   Financial Resource Strain: Not on file  Food Insecurity: Not on file  Transportation Needs: Not on file  Physical Activity: Not on file  Stress: Not on file  Social Connections: Not on file  Intimate Partner Violence: Not on file    Review of Systems: See HPI, otherwise negative ROS  Physical Exam: BP (!) 158/103   Pulse 70   Temp (!) 96 F (35.6 C) (Temporal)   Resp 20   Ht 5\' 10"  (1.778 m)   Wt 102.5 kg   SpO2 99%   BMI 32.43 kg/m  General:   Alert,  pleasant and cooperative in NAD Head:  Normocephalic and atraumatic. Neck:  Supple; no masses or thyromegaly. Lungs:  Clear throughout to auscultation, normal respiratory effort.    Heart:  +S1, +S2, Regular rate and rhythm, No edema. Abdomen:  Soft, nontender and nondistended. Normal bowel sounds,  without guarding, and without rebound.   Neurologic:  Alert and  oriented x4;  grossly normal neurologically.  Impression/Plan: Daniela Hernan is here for a colonoscopy to be performed for average risk screening.  Risks, benefits, limitations, and alternatives regarding  colonoscopy have been reviewed with the patient.  Questions have been answered.  All parties agreeable.   Pasty Spillers, MD  05/25/2020, 7:37 AM

## 2020-05-25 NOTE — Transfer of Care (Signed)
Immediate Anesthesia Transfer of Care Note  Patient: Francis Gomez  Procedure(s) Performed: COLONOSCOPY WITH PROPOFOL (N/A )  Patient Location: PACU  Anesthesia Type:General  Level of Consciousness: awake and drowsy  Airway & Oxygen Therapy: Patient Spontanous Breathing  Post-op Assessment: Report given to RN and Post -op Vital signs reviewed and stable  Post vital signs: Reviewed and stable  Last Vitals:  Vitals Value Taken Time  BP 149/96 05/25/20 0914  Temp 35.6 C 05/25/20 0914  Pulse 95 05/25/20 0921  Resp 14 05/25/20 0921  SpO2 96 % 05/25/20 0921  Vitals shown include unvalidated device data.  Last Pain:  Vitals:   05/25/20 0914  TempSrc: Temporal  PainSc: 0-No pain         Complications: No complications documented.

## 2020-05-25 NOTE — Anesthesia Procedure Notes (Signed)
Date/Time: 05/25/2020 8:22 AM Performed by: Ginger Carne, CRNA Pre-anesthesia Checklist: Patient identified, Emergency Drugs available, Suction available, Patient being monitored and Timeout performed Patient Re-evaluated:Patient Re-evaluated prior to induction Oxygen Delivery Method: Nasal cannula Preoxygenation: Pre-oxygenation with 100% oxygen Induction Type: IV induction

## 2020-05-28 ENCOUNTER — Encounter: Payer: Self-pay | Admitting: Gastroenterology

## 2020-05-28 LAB — SURGICAL PATHOLOGY

## 2020-07-13 ENCOUNTER — Ambulatory Visit (INDEPENDENT_AMBULATORY_CARE_PROVIDER_SITE_OTHER): Payer: 59 | Admitting: Family Medicine

## 2020-07-13 ENCOUNTER — Other Ambulatory Visit: Payer: Self-pay

## 2020-07-13 ENCOUNTER — Encounter: Payer: Self-pay | Admitting: Family Medicine

## 2020-07-13 VITALS — BP 126/86 | HR 80 | Resp 16 | Ht 70.0 in | Wt 225.0 lb

## 2020-07-13 DIAGNOSIS — I10 Essential (primary) hypertension: Secondary | ICD-10-CM

## 2020-07-13 DIAGNOSIS — E119 Type 2 diabetes mellitus without complications: Secondary | ICD-10-CM | POA: Diagnosis not present

## 2020-07-13 DIAGNOSIS — E782 Mixed hyperlipidemia: Secondary | ICD-10-CM

## 2020-07-13 DIAGNOSIS — E039 Hypothyroidism, unspecified: Secondary | ICD-10-CM | POA: Diagnosis not present

## 2020-07-13 NOTE — Progress Notes (Signed)
Established patient visit   Patient: Francis Gomez   DOB: [birth year]   60 y.o. Male  MRN: [id number] Visit Date: 07/13/2020  Today's healthcare provider: Dortha Kern, PA-C   Chief Complaint  Patient presents with   Diabetes   Hypertension   Hyperlipidemia   Subjective    HPI  Diabetes Mellitus Type II, follow-up  Lab Results  Component Value Date   HGBA1C 6.4 (A) 04/17/2020   HGBA1C 6.4 (A) 09/01/2019   HGBA1C 6.8 (H) 02/14/2019   Last seen for diabetes 3 months ago.  Management since then includes continuing the same treatment. He reports good compliance with treatment. He is not having side effects.   Home blood sugar records:  not being checked  Episodes of hypoglycemia? No    Current insulin regiment: none Most Recent Eye Exam: up to date  Hypertension, follow-up  BP Readings from Last 3 Encounters:  07/13/20 126/86  05/25/20 (!) 149/96  04/17/20 126/86   Wt Readings from Last 3 Encounters:  07/13/20 225 lb (102.1 kg)  05/25/20 226 lb (102.5 kg)  04/17/20 226 lb (102.5 kg)     He was last seen for hypertension 3 months ago.  BP at that visit was 149/96. Management since that visit includes no medication changes. He reports good compliance with treatment. He is not having side effects.  He is not exercising. He is not adherent to low salt diet.   Outside blood pressures are not being checked.  He does not smoke.  Use of agents associated with hypertension: none.   Lipid/Cholesterol, follow-up  Last Lipid Panel: Lab Results  Component Value Date   CHOL 143 04/17/2020   LDLCALC 55 04/17/2020   HDL 33 (L) 04/17/2020   TRIG 359 (H) 04/17/2020    He was last seen for this 3 months ago.  Management since that visit includes no medication changes.  He reports good compliance with treatment. He is not having side effects.    Last metabolic panel Lab Results  Component Value Date   GLUCOSE 113 (H) 02/14/2019   NA 141  02/14/2019   K 4.0 02/14/2019   BUN 9 02/14/2019   CREATININE 1.31 (H) 02/14/2019   GFRNONAA 60 02/14/2019   GFRAA 69 02/14/2019   CALCIUM 9.3 02/14/2019   AST 32 02/14/2019   ALT 54 (H) 02/14/2019   The 10-year ASCVD risk score Denman George DC Jr., et al., 2013) is: 23.1%  Past Medical History:  Diagnosis Date   Cervical paraspinous muscle spasm    Diabetes mellitus without complication (HCC)    Hypertension    Hypothyroidism    Obesity    Osteoarthritis    left knee   Past Surgical History:  Procedure Laterality Date   COLONOSCOPY WITH PROPOFOL N/A 05/25/2020   Procedure: COLONOSCOPY WITH PROPOFOL;  Surgeon: Pasty Spillers, MD;  Location: [facility] ENDOSCOPY;  Service: Endoscopy;  Laterality: N/A;   KNEE SURGERY Left    x's 2   Social History   Tobacco Use   Smoking status: Never   Smokeless tobacco: Never  Substance Use Topics   Alcohol use: No   Drug use: No   Family Status  Relation Name Status   Mother  Alive   Father  Deceased   Sister  Alive   Brother  Alive   Sister  Alive   Sister  Alive   Brother  Alive   Allergies  Allergen Reactions   Amlodipine Itching  Medications: Outpatient Medications Prior to Visit  Medication Sig   acetaminophen (TYLENOL) 325 MG tablet Take by mouth. Arthritis formula   atorvastatin (LIPITOR) 80 MG tablet TAKE 1 TABLET BY MOUTH EVERY DAY   levothyroxine (SYNTHROID) 25 MCG tablet TAKE 1 TABLET (25 MCG TOTAL) BY MOUTH DAILY-- BEFORE BREAKFAST.   losartan-hydrochlorothiazide (HYZAAR) 50-12.5 MG tablet TAKE 1 TABLET BY MOUTH EVERY DAY   meloxicam (MOBIC) 15 MG tablet meloxicam 15 mg tablet  TAKE 1 TABLET BY MOUTH EVERY DAY   metFORMIN (GLUCOPHAGE) 500 MG tablet TAKE 1 TABLET BY MOUTH EVERY DAY WITH BREAKFAST   tiZANidine (ZANAFLEX) 4 MG capsule Take 1 capsule (4 mg total) by mouth 3 (three) times daily as needed for muscle spasms.   verapamil (CALAN) 120 MG tablet TAKE 2 TABLETS BY MOUTH EVERY DAY   No  facility-administered medications prior to visit.    Review of Systems  Constitutional:  Negative for activity change and fatigue.  Respiratory:  Negative for cough and shortness of breath.   Cardiovascular:  Negative for chest pain, palpitations and leg swelling.  Endocrine: Negative for cold intolerance, heat intolerance, polydipsia, polyphagia and polyuria.  Neurological:  Negative for dizziness, light-headedness and headaches.      Objective    BP 126/86   Pulse 80   Resp 16   Ht 5\' 10"  (1.778 m)   Wt 225 lb (102.1 kg)   BMI 32.28 kg/m     Physical Exam Constitutional:      General: He is not in acute distress.    Appearance: He is well-developed.  HENT:     Head: Normocephalic and atraumatic.     Right Ear: Hearing normal.     Left Ear: Hearing normal.     Nose: Nose normal.  Eyes:     General: Lids are normal. No scleral icterus.       Right eye: No discharge.        Left eye: No discharge.     Conjunctiva/sclera: Conjunctivae normal.  Cardiovascular:     Rate and Rhythm: Normal rate and regular rhythm.     Pulses: Normal pulses.     Heart sounds: Normal heart sounds.  Pulmonary:     Effort: Pulmonary effort is normal. No respiratory distress.     Breath sounds: Normal breath sounds.  Abdominal:     General: Bowel sounds are normal.     Palpations: Abdomen is soft.  Musculoskeletal:        General: Normal range of motion.     Cervical back: Normal range of motion and neck supple.  Skin:    Findings: No lesion or rash.  Neurological:     Mental Status: He is alert and oriented to person, place, and time.  Psychiatric:        Speech: Speech normal.        Behavior: Behavior normal.        Thought Content: Thought content normal.    Diabetic Foot Form - Detailed   Diabetic Foot Exam - detailed Diabetic Foot exam was performed with the following findings: Yes 07/13/2020 10:36 AM  Visual Foot Exam completed.: Yes  Are the shoes appropriate in style and  fit?: Yes Is there swelling or and abnormal foot shape?: No Is there a claw toe deformity?: No Is there elevated skin temparature?: No Is there foot or ankle muscle weakness?: No Normal Range of Motion: Yes Pulse Foot Exam completed.: Yes   Right posterior Tibialias: Present Left posterior Tibialias:  Present   Right Dorsalis Pedis: Present Left Dorsalis Pedis: Present  Sensory Foot Exam Completed.: Yes Semmes-Weinstein Monofilament Test R Site 1-Great Toe: Pos L Site 1-Great Toe: Pos           No results found for any visits on 07/13/20.  Assessment & Plan     1. Essential hypertension Tolerating Hyzaar 50-12.5 mg qd with Verapamil 120 mg 2 tablets daily. No dizziness, palpitations or dyspnea. Recheck labs and continue present regimen. - CBC with Differential/Platelet - Comprehensive metabolic panel - Lipid panel - TSH  2. Diabetes mellitus without complication (HCC) No hypoglycemic episodes with Metformin 500 mg qd and diabetic diet. Recommend regular exercise. Recheck labs and follow up pending reports. - CBC with Differential/Platelet - Comprehensive metabolic panel - Hemoglobin A1c - Lipid panel  3. Mixed hyperlipidemia Still on the Atorvastatin 80 mg qd without side effects. Continue to lower fats in diet and recheck follow up labs. - CBC with Differential/Platelet - Comprehensive metabolic panel - Lipid panel - TSH  4. Hypothyroidism, unspecified type Tolerating Levothyroxine 25 mcg qd without tremor, palpitations or significant weight loss. Recheck labs to assess need for adjustments. - CBC with Differential/Platelet - Comprehensive metabolic panel - TSH   No follow-ups on file.      I, Latiqua Daloia, PA-C, have reviewed all documentation for this visit. The documentation on 07/13/20 for the exam, diagnosis, procedures, and orders are all accurate and complete.    Dortha Kern, PA-C  [facility] [phone]  (phone) [phone] (fax)  [facility]

## 2020-07-14 LAB — LIPID PANEL
Chol/HDL Ratio: 4.7 ratio (ref 0.0–5.0)
Cholesterol, Total: 151 mg/dL (ref 100–199)
HDL: 32 mg/dL — ABNORMAL LOW (ref >39)
LDL Chol Calc (NIH): 61 mg/dL (ref 0–99)
Triglycerides: 371 mg/dL — ABNORMAL HIGH (ref 0–149)
VLDL Cholesterol Cal: 58 mg/dL — ABNORMAL HIGH (ref 5–40)

## 2020-07-14 LAB — COMPREHENSIVE METABOLIC PANEL
ALT: 63 IU/L — ABNORMAL HIGH (ref 0–44)
AST: 41 IU/L — ABNORMAL HIGH (ref 0–40)
Albumin/Globulin Ratio: 1.4 (ref 1.2–2.2)
Albumin: 4.4 g/dL (ref 3.8–4.9)
Alkaline Phosphatase: 121 IU/L (ref 44–121)
BUN/Creatinine Ratio: 7 — ABNORMAL LOW (ref 9–20)
BUN: 10 mg/dL (ref 6–24)
Bilirubin Total: 0.5 mg/dL (ref 0.0–1.2)
CO2: 24 mmol/L (ref 20–29)
Calcium: 9.8 mg/dL (ref 8.7–10.2)
Chloride: 99 mmol/L (ref 96–106)
Creatinine, Ser: 1.43 mg/dL — ABNORMAL HIGH (ref 0.76–1.27)
Globulin, Total: 3.1 g/dL (ref 1.5–4.5)
Glucose: 102 mg/dL — ABNORMAL HIGH (ref 65–99)
Potassium: 3.8 mmol/L (ref 3.5–5.2)
Sodium: 141 mmol/L (ref 134–144)
Total Protein: 7.5 g/dL (ref 6.0–8.5)
eGFR: 56 mL/min/{1.73_m2} — ABNORMAL LOW (ref >59)

## 2020-07-14 LAB — CBC WITH DIFFERENTIAL/PLATELET
Basophils Absolute: 0 10*3/uL (ref 0.0–0.2)
Basos: 1 %
EOS (ABSOLUTE): 0.2 10*3/uL (ref 0.0–0.4)
Eos: 3 %
Hematocrit: 46.2 % (ref 37.5–51.0)
Hemoglobin: 15.7 g/dL (ref 13.0–17.7)
Immature Grans (Abs): 0 10*3/uL (ref 0.0–0.1)
Immature Granulocytes: 0 %
Lymphocytes Absolute: 3.2 10*3/uL — ABNORMAL HIGH (ref 0.7–3.1)
Lymphs: 49 %
MCH: 28.1 pg (ref 26.6–33.0)
MCHC: 34 g/dL (ref 31.5–35.7)
MCV: 83 fL (ref 79–97)
Monocytes Absolute: 0.4 10*3/uL (ref 0.1–0.9)
Monocytes: 6 %
Neutrophils Absolute: 2.6 10*3/uL (ref 1.4–7.0)
Neutrophils: 41 %
Platelets: 254 10*3/uL (ref 150–450)
RBC: 5.58 x10E6/uL (ref 4.14–5.80)
RDW: 13.9 % (ref 11.6–15.4)
WBC: 6.4 10*3/uL (ref 3.4–10.8)

## 2020-07-14 LAB — HEMOGLOBIN A1C
Est. average glucose Bld gHb Est-mCnc: 131 mg/dL
Hgb A1c MFr Bld: 6.2 % — ABNORMAL HIGH (ref 4.8–5.6)

## 2020-07-14 LAB — TSH: TSH: 3.37 u[IU]/mL (ref 0.450–4.500)

## 2020-08-12 IMAGING — CR DG KNEE 1-2V*L*
2 series · 2 of 2 positions shown · non-contrast
Comparison: None.

CLINICAL DATA: Chronic left knee pain

EXAM:
LEFT KNEE - 1-2 VIEW

[knee ap]
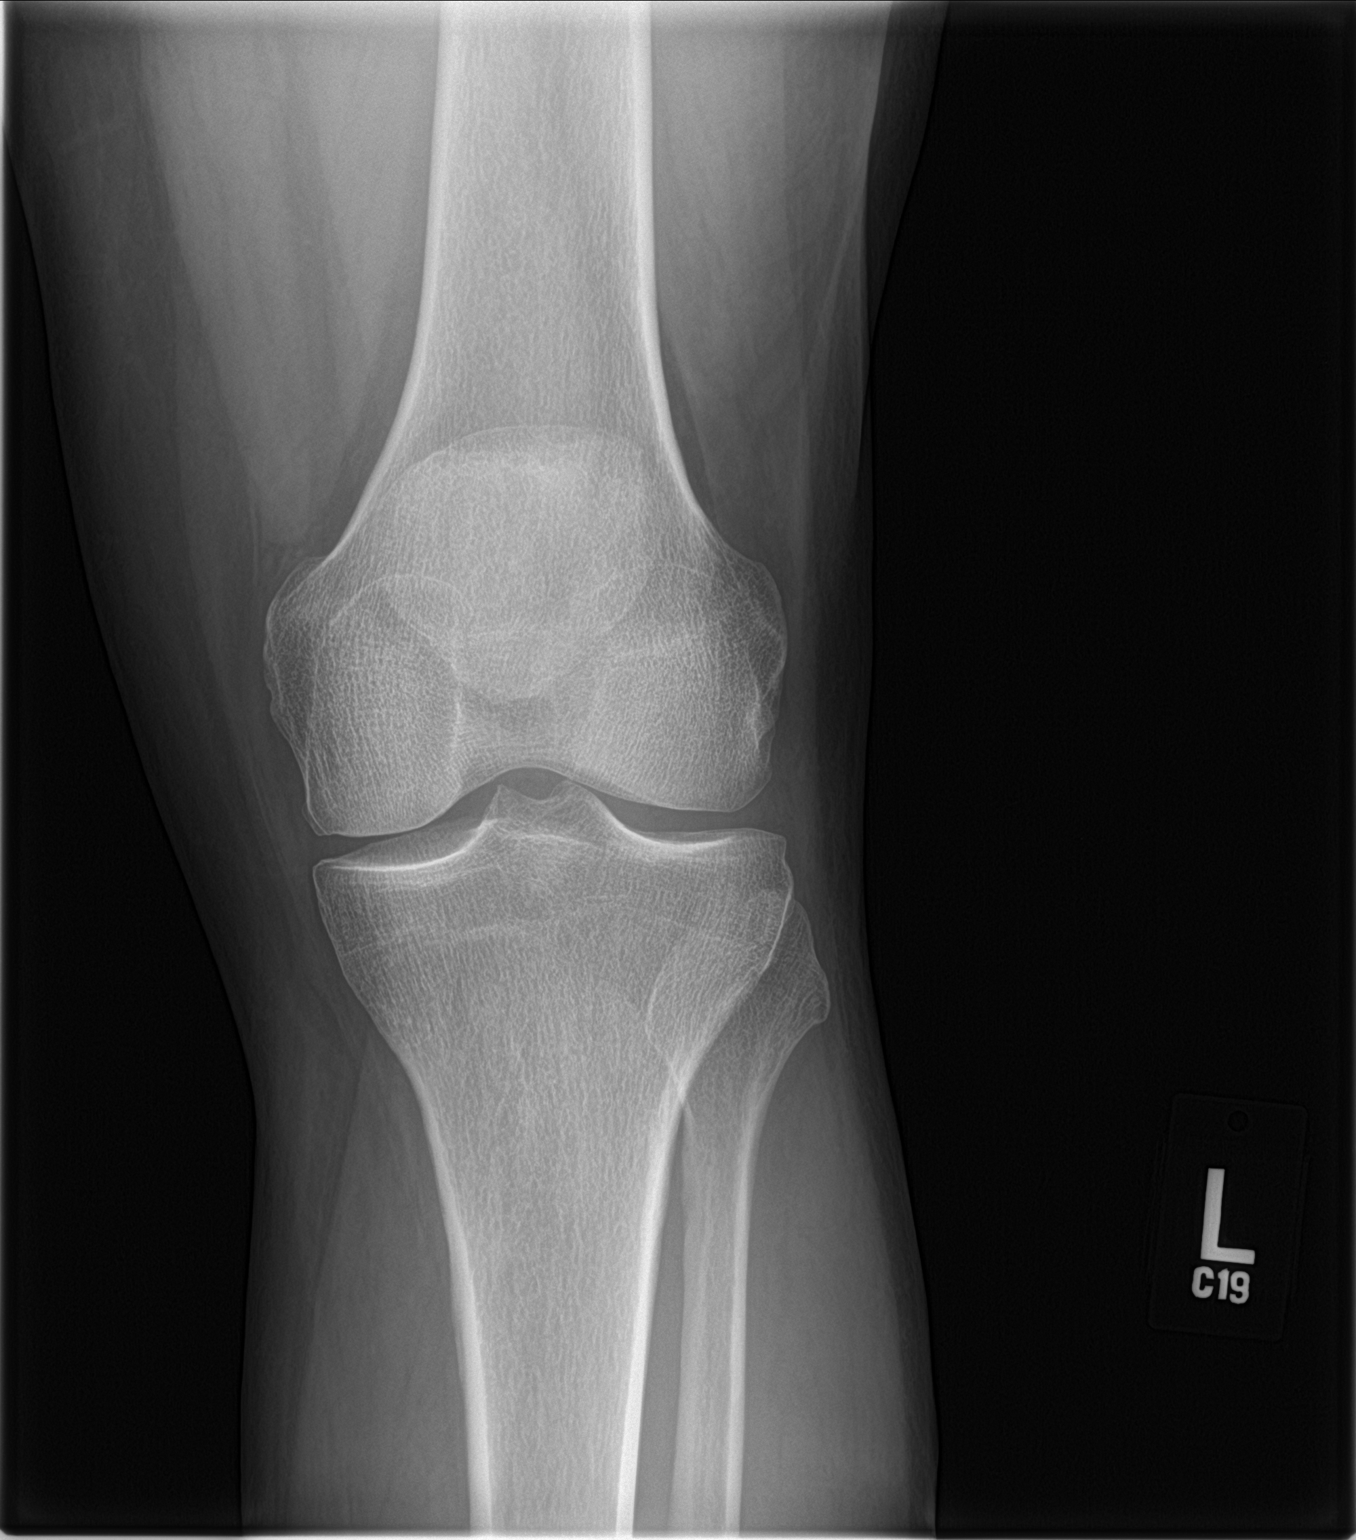

[knee lat]
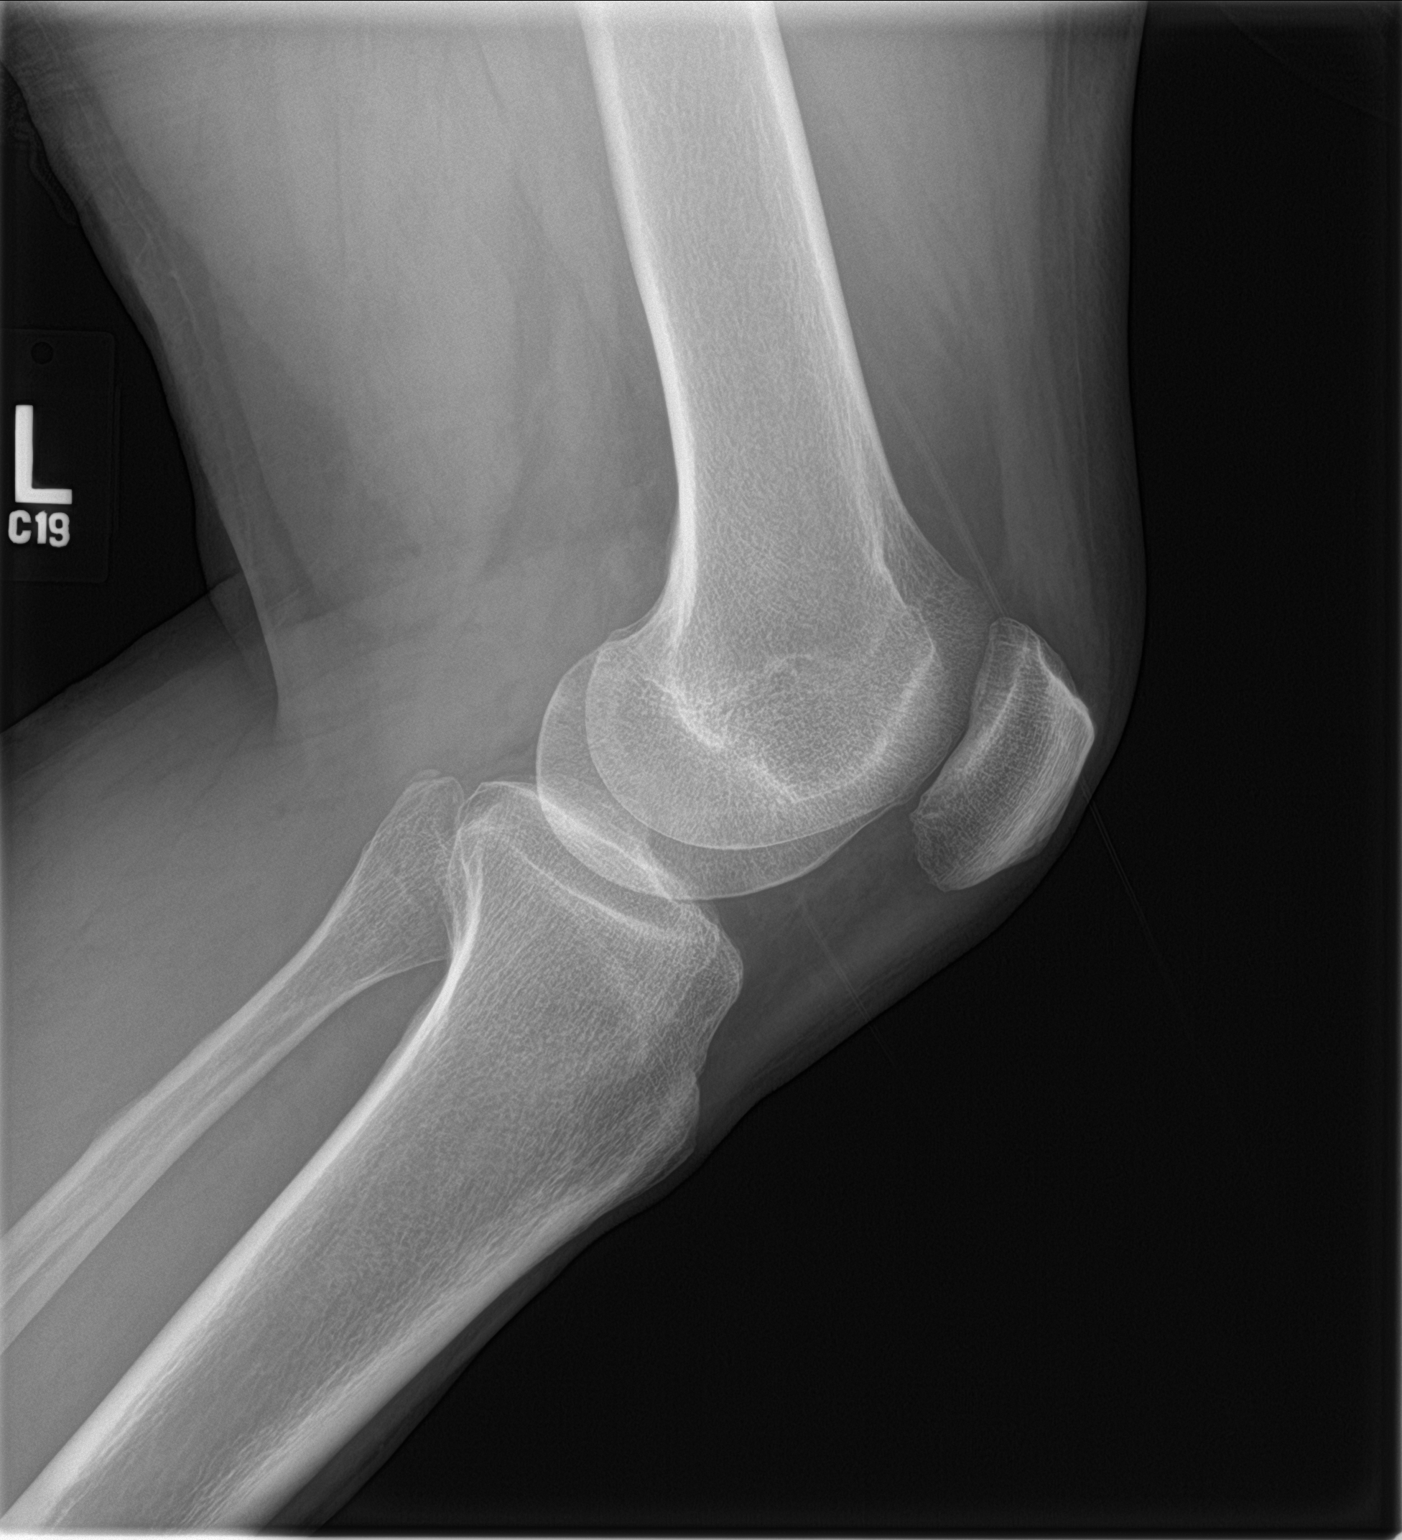

[2 of 2 positions shown; findings below may reference images not displayed]

FINDINGS: No fracture or dislocation of the left knee. Joint spaces are well
preserved. No knee joint effusion. Soft tissues are unremarkable.
IMPRESSION: No fracture or dislocation of the left knee. Joint spaces are well
preserved.

## 2020-10-18 ENCOUNTER — Ambulatory Visit: Payer: 59 | Admitting: Family Medicine

## 2020-10-22 ENCOUNTER — Other Ambulatory Visit: Payer: Self-pay | Admitting: Family Medicine

## 2020-10-22 DIAGNOSIS — I10 Essential (primary) hypertension: Secondary | ICD-10-CM

## 2020-10-22 MED ORDER — LOSARTAN POTASSIUM-HCTZ 50-12.5 MG PO TABS: 1.0000 | ORAL_TABLET | Freq: Every day | ORAL | 0 refills | Status: DC

## 2020-10-22 NOTE — Telephone Encounter (Signed)
Requested Prescriptions  Pending Prescriptions Disp Refills  . losartan-hydrochlorothiazide (HYZAAR) 50-12.5 MG tablet 90 tablet 0    Sig: Take 1 tablet by mouth daily.     Cardiovascular: ARB + Diuretic Combos Failed - 10/22/2020  3:23 PM      Failed - Cr in normal range and within 180 days    Creatinine, Ser  Date Value Ref Range Status  07/13/2020 1.43 (H) 0.76 - 1.27 mg/dL Final   Creatinine, POC  Date Value Ref Range Status  01/20/2017 NA mg/dL Final         Passed - K in normal range and within 180 days    Potassium  Date Value Ref Range Status  07/13/2020 3.8 3.5 - 5.2 mmol/L Final         Passed - Na in normal range and within 180 days    Sodium  Date Value Ref Range Status  07/13/2020 141 134 - 144 mmol/L Final         Passed - Ca in normal range and within 180 days    Calcium  Date Value Ref Range Status  07/13/2020 9.8 8.7 - 10.2 mg/dL Final         Passed - Patient is not pregnant      Passed - Last BP in normal range    BP Readings from Last 1 Encounters:  07/13/20 126/86         Passed - Valid encounter within last 6 months    Recent Outpatient Visits          3 months ago Essential hypertension   [facility] Chrismon, Jodell Cipro, PA-C   6 months ago Primary hypertension   [facility] Just, Azalee Course, FNP   1 year ago Prediabetes   [facility], [place], [place]   1 year ago Cervical paraspinous muscle spasm   [facility], Jodell Cipro, PA-C   1 year ago Essential hypertension   [facility], Jodell Cipro, PA-C      Future Appointments            In 3 weeks Maple Hudson., MD [facility]

## 2020-10-22 NOTE — Telephone Encounter (Signed)
Copied from CRM [id number]. Topic: Quick Communication - Rx Refill/Question >> Oct 22, 2020  8:04 AM Jaquita Rector A wrote: Medication: losartan-hydrochlorothiazide (HYZAAR) 50-12.5 MG tablet  Patient out of medication called in last week for refill please advise  Has the patient contacted their pharmacy? Yes.  Informed that they are waiting on the Dr to refill  (Agent: If no, request that the patient contact the pharmacy for the refill.) (Agent: If yes, when and what did the pharmacy advise?)  Preferred Pharmacy (with phone number or street name): [id number] [place],  - [address]  Phone:  [phone] Fax:  [phone]    Has the patient been seen for an appointment in the last year OR does the patient have an upcoming appointment? Yes.    Agent: Please be advised that RX refills may take up to 3 business days. We ask that you follow-up with your pharmacy.

## 2020-11-05 ENCOUNTER — Telehealth: Payer: Self-pay | Admitting: Family Medicine

## 2020-11-05 DIAGNOSIS — R7303 Prediabetes: Secondary | ICD-10-CM

## 2020-11-05 DIAGNOSIS — E119 Type 2 diabetes mellitus without complications: Secondary | ICD-10-CM

## 2020-11-05 MED ORDER — METFORMIN HCL 500 MG PO TABS: ORAL_TABLET | ORAL | 0 refills | Status: DC

## 2020-11-05 NOTE — Telephone Encounter (Signed)
[facility] faxed refill request for the following medications: ° °metFORMIN (GLUCOPHAGE) 500 MG tablet  ° °Please advise. ° °

## 2020-11-09 ENCOUNTER — Telehealth: Payer: Self-pay | Admitting: Family Medicine

## 2020-11-09 DIAGNOSIS — I10 Essential (primary) hypertension: Secondary | ICD-10-CM

## 2020-11-09 MED ORDER — VERAPAMIL HCL 120 MG PO TABS: 240.0000 mg | ORAL_TABLET | Freq: Every day | ORAL | 1 refills | Status: DC

## 2020-11-09 NOTE — Addendum Note (Signed)
Addended by: Marlene Lard on: 11/09/2020 09:15 AM   Modules accepted: Orders

## 2020-11-09 NOTE — Telephone Encounter (Signed)
[facility] faxed refill request for the following medications:  verapamil (CALAN) 120 MG tablet   Please advise.

## 2020-11-14 ENCOUNTER — Ambulatory Visit (INDEPENDENT_AMBULATORY_CARE_PROVIDER_SITE_OTHER): Payer: 59 | Admitting: Family Medicine

## 2020-11-14 ENCOUNTER — Other Ambulatory Visit: Payer: Self-pay

## 2020-11-14 ENCOUNTER — Encounter: Payer: Self-pay | Admitting: Family Medicine

## 2020-11-14 VITALS — BP 114/75 | HR 88 | Temp 98.0°F | Resp 16 | Ht 70.0 in | Wt 220.0 lb

## 2020-11-14 DIAGNOSIS — G8929 Other chronic pain: Secondary | ICD-10-CM

## 2020-11-14 DIAGNOSIS — E6609 Other obesity due to excess calories: Secondary | ICD-10-CM

## 2020-11-14 DIAGNOSIS — M25561 Pain in right knee: Secondary | ICD-10-CM

## 2020-11-14 DIAGNOSIS — E0822 Diabetes mellitus due to underlying condition with diabetic chronic kidney disease: Secondary | ICD-10-CM | POA: Diagnosis not present

## 2020-11-14 DIAGNOSIS — E782 Mixed hyperlipidemia: Secondary | ICD-10-CM

## 2020-11-14 DIAGNOSIS — N1831 Chronic kidney disease, stage 3a: Secondary | ICD-10-CM | POA: Diagnosis not present

## 2020-11-14 DIAGNOSIS — Z23 Encounter for immunization: Secondary | ICD-10-CM | POA: Diagnosis not present

## 2020-11-14 DIAGNOSIS — I10 Essential (primary) hypertension: Secondary | ICD-10-CM

## 2020-11-14 DIAGNOSIS — E119 Type 2 diabetes mellitus without complications: Secondary | ICD-10-CM

## 2020-11-14 LAB — POCT GLYCOSYLATED HEMOGLOBIN (HGB A1C): Hemoglobin A1C: 6.2 % — AB (ref 4.0–5.6)

## 2020-11-14 NOTE — Progress Notes (Signed)
I,[name],acting as a scribe for Wilhemena Durie, MD.,have documented all relevant documentation on the behalf of Wilhemena Durie, MD,as directed by  Wilhemena Durie, MD while in the presence of Wilhemena Durie, MD.   Established patient visit   Patient: Francis Gomez   DOB: 09/19/1960   60 y.o. Male  MRN: [id number] Visit Date: 11/14/2020  Today's healthcare provider: Wilhemena Durie, MD   Chief Complaint  Patient presents with   Diabetes   Follow-up   Subjective    HPI  Delightful gentleman comes in today for follow-up of diabetes hypertension hyperlipidemia.  He feels well with no complaints. Diabetes Mellitus Type II, Follow-up  Lab Results  Component Value Date   HGBA1C 6.2 (A) 11/14/2020   HGBA1C 6.2 (H) 07/13/2020   HGBA1C 6.4 (A) 04/17/2020   Wt Readings from Last 3 Encounters:  11/14/20 220 lb (99.8 kg)  07/13/20 225 lb (102.1 kg)  05/25/20 226 lb (102.5 kg)   Last seen for diabetes 4 months ago.  Management since then includes none. He reports good compliance with treatment. He is not having side effects. none   Home blood sugar records: fasting range: not checking  Episodes of hypoglycemia? No none   Current insulin regiment: n/a Most Recent Eye Exam: due Current exercise: walking Current diet habits: well balanced  Pertinent Labs: Lab Results  Component Value Date   CHOL 151 07/13/2020   HDL 32 (L) 07/13/2020   LDLCALC 61 07/13/2020   TRIG 371 (H) 07/13/2020   CHOLHDL 4.7 07/13/2020   Lab Results  Component Value Date   NA 141 07/13/2020   K 3.8 07/13/2020   CREATININE 1.43 (H) 07/13/2020   EGFR 56 (L) 07/13/2020   MICROALBUR 20 03/26/2018     ---------------------------------------------------------------------------------------------------     Medications: Outpatient Medications Prior to Visit  Medication Sig   atorvastatin (LIPITOR) 80 MG tablet TAKE 1 TABLET BY MOUTH EVERY DAY   levothyroxine  (SYNTHROID) 25 MCG tablet TAKE 1 TABLET (25 MCG TOTAL) BY MOUTH DAILY-- BEFORE BREAKFAST.   losartan-hydrochlorothiazide (HYZAAR) 50-12.5 MG tablet Take 1 tablet by mouth daily.   metFORMIN (GLUCOPHAGE) 500 MG tablet TAKE 1 TABLET BY MOUTH EVERY DAY WITH BREAKFAST   verapamil (CALAN) 120 MG tablet Take 2 tablets (240 mg total) by mouth daily.   No facility-administered medications prior to visit.    Review of Systems  Last hemoglobin A1c Lab Results  Component Value Date   HGBA1C 6.2 (A) 11/14/2020       Objective    BP 114/75 (BP Location: Left Arm, Patient Position: Sitting, Cuff Size: Large)   Pulse 88   Temp 98 F (36.7 C) (Temporal)   Resp 16   Ht 5' 10"  (1.778 m)   Wt 220 lb (99.8 kg)   SpO2 99%   BMI 31.57 kg/m  BP Readings from Last 3 Encounters:  11/14/20 114/75  07/13/20 126/86  05/25/20 (!) 149/96   Wt Readings from Last 3 Encounters:  11/14/20 220 lb (99.8 kg)  07/13/20 225 lb (102.1 kg)  05/25/20 226 lb (102.5 kg)      Physical Exam Vitals reviewed.  Constitutional:      General: He is not in acute distress.    Appearance: He is well-developed.  HENT:     Head: Normocephalic and atraumatic.     Right Ear: Hearing normal.     Left Ear: Hearing normal.     Nose: Nose normal.  Eyes:  General: Lids are normal. No scleral icterus.       Right eye: No discharge.        Left eye: No discharge.     Conjunctiva/sclera: Conjunctivae normal.  Cardiovascular:     Rate and Rhythm: Normal rate and regular rhythm.     Pulses: Normal pulses.     Heart sounds: Normal heart sounds.  Pulmonary:     Effort: Pulmonary effort is normal. No respiratory distress.     Breath sounds: Normal breath sounds.  Abdominal:     General: Bowel sounds are normal.     Palpations: Abdomen is soft.  Musculoskeletal:        General: Normal range of motion.     Cervical back: Normal range of motion and neck supple.  Skin:    Findings: No lesion or rash.  Neurological:      Mental Status: He is alert and oriented to person, place, and time.  Psychiatric:        Speech: Speech normal.        Behavior: Behavior normal.        Thought Content: Thought content normal.      Results for orders placed or performed in visit on 11/14/20  POCT glycosylated hemoglobin (Hb A1C)  Result Value Ref Range   Hemoglobin A1C 6.2 (A) 4.0 - 5.6 %   Est. average glucose Bld gHb Est-mCnc      Assessment & Plan     1. 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  Avoid nonsteroidals for kidneys  2. Diabetes mellitus without complication ([place])  - POCT glycosylated hemoglobin (Hb A1C)  3. Primary hypertension   4. Mixed hyperlipidemia LDL less than 70 on statin  5. Class 1 obesity due to excess calories with serious comorbidity and body mass index (BMI) of 32.0 to 32.9 in adult   6. Bilateral chronic knee pain   7. Need for pneumococcal vaccine    Return in about 4 months (around 03/14/2021).      I, [name], CMA, have reviewed all documentation for this visit. The documentation on 11/14/20 for the exam, diagnosis, procedures, and orders are all accurate and complete.    Jt Brabec Cranford Mon, MD  [facility] [phone] (phone) [phone] (fax)  [place]

## 2021-01-30 ENCOUNTER — Other Ambulatory Visit: Payer: Self-pay | Admitting: Family Medicine

## 2021-01-30 DIAGNOSIS — R7303 Prediabetes: Secondary | ICD-10-CM

## 2021-01-30 DIAGNOSIS — E119 Type 2 diabetes mellitus without complications: Secondary | ICD-10-CM

## 2021-01-30 NOTE — Telephone Encounter (Signed)
Requested Prescriptions  Pending Prescriptions Disp Refills   metFORMIN (GLUCOPHAGE) 500 MG tablet [Pharmacy Med Name: METFORMIN HCL 500 MG TABLET] 90 tablet 0    Sig: TAKE 1 TABLET BY MOUTH EVERY DAY WITH BREAKFAST     Endocrinology:  Diabetes - Biguanides Failed - 01/30/2021  1:28 AM      Failed - Cr in normal range and within 360 days    Creatinine, Ser  Date Value Ref Range Status  07/13/2020 1.43 (H) 0.76 - 1.27 mg/dL Final   Creatinine, POC  Date Value Ref Range Status  01/20/2017 NA mg/dL Final         Failed - AA eGFR in normal range and within 360 days    GFR calc Af Amer  Date Value Ref Range Status  02/14/2019 69 >59 mL/min/1.73 Final   GFR calc non Af Amer  Date Value Ref Range Status  02/14/2019 60 >59 mL/min/1.73 Final   eGFR  Date Value Ref Range Status  07/13/2020 56 (L) >59 mL/min/1.73 Final         Passed - HBA1C is between 0 and 7.9 and within 180 days    Hemoglobin A1C  Date Value Ref Range Status  11/14/2020 6.2 (A) 4.0 - 5.6 % Final  10/16/2015 6.2  Final   Hgb A1c MFr Bld  Date Value Ref Range Status  07/13/2020 6.2 (H) 4.8 - 5.6 % Final    Comment:             Prediabetes: 5.7 - 6.4          Diabetes: >6.4          Glycemic control for adults with diabetes: <7.0          Passed - Valid encounter within last 6 months    Recent Outpatient Visits          2 months ago Diabetes mellitus without complication [facility])   [facility] Jerrol Banana., MD   6 months ago Essential hypertension   [place], Vickki Muff, PA-C   9 months ago Primary hypertension   [employer] [facility], Laurita Quint, FNP   1 year ago Prediabetes   [employer], [place], [place]   1 year ago Cervical paraspinous muscle spasm   [employer], Vickki Muff, PA-C      Future Appointments            In 1 month Gwyneth Sprout, [place], [place]

## 2021-02-21 DIAGNOSIS — H2513 Age-related nuclear cataract, bilateral: Secondary | ICD-10-CM | POA: Diagnosis not present

## 2021-02-21 DIAGNOSIS — H524 Presbyopia: Secondary | ICD-10-CM | POA: Diagnosis not present

## 2021-02-21 LAB — HM DIABETES EYE EXAM

## 2021-03-05 ENCOUNTER — Other Ambulatory Visit: Payer: Self-pay | Admitting: Family Medicine

## 2021-03-05 DIAGNOSIS — I10 Essential (primary) hypertension: Secondary | ICD-10-CM

## 2021-03-05 NOTE — Telephone Encounter (Signed)
Requested medications are due for refill today.  yes  Requested medications are on the active medications list.  yes  Last refill. 10/22/2020  Future visit scheduled.   yes  Notes to clinic.  PCP still showing as [facility].    Requested Prescriptions  Pending Prescriptions Disp Refills   losartan-hydrochlorothiazide (HYZAAR) 50-12.5 MG tablet [Pharmacy Med Name: LOSARTAN-HCTZ 50-12.5 MG TAB] 30 tablet 2    Sig: TAKE 1 TABLET BY MOUTH EVERY DAY     Cardiovascular: ARB + Diuretic Combos Failed - 03/05/2021  8:30 AM      Failed - K in normal range and within 180 days    Potassium  Date Value Ref Range Status  07/13/2020 3.8 3.5 - 5.2 mmol/L Final          Failed - Na in normal range and within 180 days    Sodium  Date Value Ref Range Status  07/13/2020 141 134 - 144 mmol/L Final          Failed - Cr in normal range and within 180 days    Creatinine, Ser  Date Value Ref Range Status  07/13/2020 1.43 (H) 0.76 - 1.27 mg/dL Final   Creatinine, POC  Date Value Ref Range Status  01/20/2017 NA mg/dL Final          Failed - eGFR is 10 or above and within 180 days    GFR calc Af Amer  Date Value Ref Range Status  02/14/2019 69 >59 mL/min/1.73 Final   GFR calc non Af Amer  Date Value Ref Range Status  02/14/2019 60 >59 mL/min/1.73 Final   eGFR  Date Value Ref Range Status  07/13/2020 56 (L) >59 mL/min/1.73 Final          Passed - Patient is not pregnant      Passed - Last BP in normal range    BP Readings from Last 1 Encounters:  11/14/20 114/75          Passed - Valid encounter within last 6 months    Recent Outpatient Visits           3 months ago Diabetes mellitus without complication [facility])   [facility] Jerrol Banana., MD   7 months ago Essential hypertension   [place], Vickki Muff, PA-C   10 months ago Primary hypertension   [employer] [facility], Laurita Quint, FNP   1 year ago Prediabetes    [place], [place], [place]   1 year ago Cervical paraspinous muscle spasm   [employer], Vickki Muff, PA-C       Future Appointments             In 1 week Gwyneth Sprout, [place], [facility]

## 2021-03-13 ENCOUNTER — Encounter: Payer: Self-pay | Admitting: Family Medicine

## 2021-03-13 ENCOUNTER — Ambulatory Visit (INDEPENDENT_AMBULATORY_CARE_PROVIDER_SITE_OTHER): Payer: Medicare Other | Admitting: Family Medicine

## 2021-03-13 ENCOUNTER — Other Ambulatory Visit: Payer: Self-pay

## 2021-03-13 ENCOUNTER — Other Ambulatory Visit: Payer: Self-pay | Admitting: Family Medicine

## 2021-03-13 VITALS — BP 116/81 | HR 94 | Temp 98.1°F | Resp 16 | Wt 214.4 lb

## 2021-03-13 DIAGNOSIS — E785 Hyperlipidemia, unspecified: Secondary | ICD-10-CM

## 2021-03-13 DIAGNOSIS — E1169 Type 2 diabetes mellitus with other specified complication: Secondary | ICD-10-CM

## 2021-03-13 DIAGNOSIS — I152 Hypertension secondary to endocrine disorders: Secondary | ICD-10-CM | POA: Insufficient documentation

## 2021-03-13 DIAGNOSIS — E1159 Type 2 diabetes mellitus with other circulatory complications: Secondary | ICD-10-CM | POA: Diagnosis not present

## 2021-03-13 DIAGNOSIS — E039 Hypothyroidism, unspecified: Secondary | ICD-10-CM

## 2021-03-13 DIAGNOSIS — E0822 Diabetes mellitus due to underlying condition with diabetic chronic kidney disease: Secondary | ICD-10-CM | POA: Diagnosis not present

## 2021-03-13 DIAGNOSIS — Z23 Encounter for immunization: Secondary | ICD-10-CM | POA: Insufficient documentation

## 2021-03-13 DIAGNOSIS — N1831 Chronic kidney disease, stage 3a: Secondary | ICD-10-CM | POA: Diagnosis not present

## 2021-03-13 LAB — POCT GLYCOSYLATED HEMOGLOBIN (HGB A1C): Hemoglobin A1C: 6.3 % — AB (ref 4.0–5.6)

## 2021-03-13 NOTE — Assessment & Plan Note (Signed)
Provided today 

## 2021-03-13 NOTE — Assessment & Plan Note (Signed)
Chronic, previously stable ?Controlled on synthroid ?Has been taking medication with metformin ?Check TSH and free T4, recommend medication 30 mins prior to other food, drink or meds- water alone ?

## 2021-03-13 NOTE — Progress Notes (Signed)
Established patient visit   Patient: Francis Gomez   DOB: January 27, 1960   61 y.o. Male  MRN: [id number] Visit Date: 03/13/2021  Today's healthcare provider: Gwyneth Sprout, FNP  Patient presents for continued chronic needs OV to establish care with new PCP.  Introduced to Designer, jewellery role and practice setting.  All questions answered.  Discussed provider/patient relationship and expectations.   Chief Complaint  Patient presents with   Diabetes   Hypertension   Hyperlipidemia   Subjective    HPI  Diabetes Mellitus Type II, follow-up  Lab Results  Component Value Date   HGBA1C 6.3 (A) 03/13/2021   HGBA1C 6.2 (A) 11/14/2020   HGBA1C 6.2 (H) 07/13/2020   Last seen for diabetes 4 months ago.  Management since then includes continuing the same treatment. He reports excellent compliance with treatment. He is not having side effects.   Home blood sugar records:  not checked  Episodes of hypoglycemia? No    Current insulin regiment: none Most Recent Eye Exam: UTD  --------------------------------------------------------------------------------------------------- Hypertension, follow-up  BP Readings from Last 3 Encounters:  03/13/21 116/81  11/14/20 114/75  07/13/20 126/86   Wt Readings from Last 3 Encounters:  03/13/21 214 lb 6.4 oz (97.3 kg)  11/14/20 220 lb (99.8 kg)  07/13/20 225 lb (102.1 kg)     He was last seen for hypertension 4 months ago.  BP at that visit was 114/75. Management since that visit includes none. He reports excellent compliance with treatment. He is not having side effects.  He is exercising. He is adherent to low salt diet.   Outside blood pressures are checked on occasion <130/80.  He does not smoke.  Use of agents associated with hypertension: none.   --------------------------------------------------------------------------------------------------- Lipid/Cholesterol, follow-up  Last Lipid Panel: Lab Results   Component Value Date   CHOL 151 07/13/2020   LDLCALC 61 07/13/2020   HDL 32 (L) 07/13/2020   TRIG 371 (H) 07/13/2020    He was last seen for this 4 months ago.  Management since that visit includes none.  He reports excellent compliance with treatment. He is not having side effects.   Symptoms: No appetite changes No foot ulcerations  No chest pain No chest pressure/discomfort  No dyspnea No orthopnea  No fatigue No lower extremity edema  No palpitations No paroxysmal nocturnal dyspnea  No nausea No numbness or tingling of extremity  No polydipsia No polyuria  No speech difficulty No syncope   He is following a Regular diet. Current exercise: walking  Last metabolic panel Lab Results  Component Value Date   GLUCOSE 102 (H) 07/13/2020   NA 141 07/13/2020   K 3.8 07/13/2020   BUN 10 07/13/2020   CREATININE 1.43 (H) 07/13/2020   EGFR 56 (L) 07/13/2020   GFRNONAA 60 02/14/2019   CALCIUM 9.8 07/13/2020   AST 41 (H) 07/13/2020   ALT 63 (H) 07/13/2020   The 10-year ASCVD risk score (Arnett DK, et al., 2019) is: 21.3%  ---------------------------------------------------------------------------------------------------   Medications: Outpatient Medications Prior to Visit  Medication Sig   atorvastatin (LIPITOR) 80 MG tablet TAKE 1 TABLET BY MOUTH EVERY DAY   levothyroxine (SYNTHROID) 25 MCG tablet TAKE 1 TABLET (25 MCG TOTAL) BY MOUTH DAILY-- BEFORE BREAKFAST.   losartan-hydrochlorothiazide (HYZAAR) 50-12.5 MG tablet TAKE 1 TABLET BY MOUTH EVERY DAY   metFORMIN (GLUCOPHAGE) 500 MG tablet TAKE 1 TABLET BY MOUTH EVERY DAY WITH BREAKFAST   verapamil (CALAN) 120 MG tablet Take 2 tablets (240  mg total) by mouth daily.   No facility-administered medications prior to visit.    Review of Systems     Objective    BP 116/81    Pulse 94    Temp 98.1 F (36.7 C) (Oral)    Resp 16    Wt 214 lb 6.4 oz (97.3 kg)    BMI 30.76 kg/m    Physical Exam Vitals and nursing note  reviewed.  Constitutional:      Appearance: Normal appearance. He is obese.  HENT:     Head: Normocephalic and atraumatic.  Eyes:     Pupils: Pupils are equal, round, and reactive to light.  Cardiovascular:     Rate and Rhythm: Normal rate and regular rhythm.     Pulses: Normal pulses.     Heart sounds: Normal heart sounds.  Pulmonary:     Effort: Pulmonary effort is normal.     Breath sounds: Normal breath sounds.  Musculoskeletal:        General: Normal range of motion.     Cervical back: Normal range of motion.  Skin:    General: Skin is warm and dry.     Capillary Refill: Capillary refill takes less than 2 seconds.  Neurological:     General: No focal deficit present.     Mental Status: He is alert and oriented to person, place, and time. Mental status is at baseline.  Psychiatric:        Mood and Affect: Mood normal.        Behavior: Behavior normal.        Thought Content: Thought content normal.        Judgment: Judgment normal.     Results for orders placed or performed in visit on 03/13/21  POCT glycosylated hemoglobin (Hb A1C)  Result Value Ref Range   Hemoglobin A1C 6.3 (A) 4.0 - 5.6 %   HbA1c POC (<> result, manual entry)     HbA1c, POC (prediabetic range)     HbA1c, POC (controlled diabetic range)      Assessment & Plan     Problem List Items Addressed This Visit       Cardiovascular and Mediastinum   Hypertension associated with diabetes ([place])    Chronic, stable- BP at goal today 116/81- not checking at home Regular diet, walking for exercise Continue medications; great compliance Will check CMP, FLP, CBC Denies CP, SOB, DOE, LE, signs/symptoms of low blood pressure, vision changes Seek emergent care if needed       Relevant Orders   Comprehensive metabolic panel   CBC with Differential/Platelet   Urine Microalbumin w/creat. 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 Smaller meal size, adding snacks. Choosing water as drink of choice and increasing purposeful exercise. Will get urine micro today Will get records from eye exam      Relevant Orders   POCT glycosylated hemoglobin (Hb A1C) (Completed)   Comprehensive metabolic panel   Lipid panel   CBC with Differential/Platelet   Urine Microalbumin w/creat. ratio   Hyperlipidemia associated with type 2 diabetes mellitus (HCC)    Chronic, previously well controlled On lipitor 80 mg No complaints/side effects Good compliance Working on diet Walking for exercise  The 10-year ASCVD risk score (Arnett DK, et al., 2019) is: 21.3%   Values used to calculate the score:     Age: 43 years     Sex: Male     Is Non-Hispanic African American: Yes     Diabetic: Yes     Tobacco smoker: No     Systolic Blood Pressure: 431 mmHg     Is BP treated: Yes     HDL Cholesterol: 32 mg/dL     Total Cholesterol: 151 mg/dL       Relevant Orders   Lipid panel   Hypothyroidism    Chronic, previously stable Controlled on synthroid Has been taking medication with metformin Check TSH and free T4, recommend medication 30 mins prior to other food, drink or meds- water alone      Relevant Orders   TSH + free T4     Other   Need for influenza vaccination    Provided today      Relevant Orders   Flu Vaccine QUAD 66moIM (Fluarix, Fluzone & Alfiuria Quad PF) (Completed)     Return in about 6 months (around 09/13/2021) for annual examination.      IVonna Kotyk FNP, have reviewed all documentation for this visit. The documentation on 03/13/21 for the exam, diagnosis, procedures, and orders are all accurate and complete.   IFritzi MandesWolford,acting as a sEducation administratorfor EGwyneth Sprout FNP.,have documented all  relevant documentation on the behalf of EGwyneth Sprout FNP,as directed by  EGwyneth Sprout FNP while in the presence of EGwyneth Sprout FNP.   [place]wyneth Sprout [phone](phone) [phone](fax)  [place]

## 2021-03-13 NOTE — Assessment & Plan Note (Signed)
DM with Kidney involvement ?Will recheck CMP and POC A1c completed- good glycemic control, congratulated  ?Continue to recommend balanced, lower carb meals. Smaller meal size, adding snacks. Choosing water as drink of choice and increasing purposeful exercise. ?Will get urine micro today ?Will get records from eye exam ?

## 2021-03-13 NOTE — Assessment & Plan Note (Signed)
Chronic, stable- BP at goal today 116/81- not checking at home ?Regular diet, walking for exercise ?Continue medications; great compliance ?Will check CMP, FLP, CBC ?Denies CP, SOB, DOE, LE, signs/symptoms of low blood pressure, vision changes ?Seek emergent care if needed ? ?

## 2021-03-13 NOTE — Assessment & Plan Note (Signed)
Chronic, previously well controlled ?On lipitor 80 mg ?No complaints/side effects ?Good compliance ?Working on diet ?Walking for exercise ? ?The 10-year ASCVD risk score (Arnett DK, et al., 2019) is: 21.3% ?  Values used to calculate the score: ?    Age: 61 years ?    Sex: Male ?    Is Non-Hispanic African American: Yes ?    Diabetic: Yes ?    Tobacco smoker: No ?    Systolic Blood Pressure: 116 mmHg ?    Is BP treated: Yes ?    HDL Cholesterol: 32 mg/dL ?    Total Cholesterol: 151 mg/dL ? ?

## 2021-03-14 LAB — MICROALBUMIN / CREATININE URINE RATIO
Creatinine, Urine: 257.5 mg/dL
Microalb/Creat Ratio: 4 mg/g creat (ref 0–29)
Microalbumin, Urine: 11.5 ug/mL

## 2021-03-18 ENCOUNTER — Telehealth: Payer: Self-pay | Admitting: Family Medicine

## 2021-03-18 ENCOUNTER — Other Ambulatory Visit: Payer: Self-pay

## 2021-03-18 DIAGNOSIS — E0822 Diabetes mellitus due to underlying condition with diabetic chronic kidney disease: Secondary | ICD-10-CM | POA: Diagnosis not present

## 2021-03-18 DIAGNOSIS — N1831 Chronic kidney disease, stage 3a: Secondary | ICD-10-CM | POA: Diagnosis not present

## 2021-03-18 DIAGNOSIS — E039 Hypothyroidism, unspecified: Secondary | ICD-10-CM | POA: Diagnosis not present

## 2021-03-18 MED ORDER — ATORVASTATIN CALCIUM 80 MG PO TABS
80.0000 mg | ORAL_TABLET | Freq: Every day | ORAL | 3 refills | Status: DC
Start: 2021-03-18 — End: 2022-03-06

## 2021-03-18 NOTE — Telephone Encounter (Signed)
[facility] faxed refill request for the following medications: ? ?atorvastatin (LIPITOR) 80 MG tablet ? ? ?Please advise ? ?

## 2021-03-19 LAB — CBC WITH DIFFERENTIAL/PLATELET
Basophils Absolute: 0 10*3/uL (ref 0.0–0.2)
Basos: 1 %
EOS (ABSOLUTE): 0.1 10*3/uL (ref 0.0–0.4)
Eos: 2 %
Hematocrit: 44.4 % (ref 37.5–51.0)
Hemoglobin: 14.4 g/dL (ref 13.0–17.7)
Immature Grans (Abs): 0 10*3/uL (ref 0.0–0.1)
Immature Granulocytes: 0 %
Lymphocytes Absolute: 2.9 10*3/uL (ref 0.7–3.1)
Lymphs: 47 %
MCH: 27.4 pg (ref 26.6–33.0)
MCHC: 32.4 g/dL (ref 31.5–35.7)
MCV: 85 fL (ref 79–97)
Monocytes Absolute: 0.4 10*3/uL (ref 0.1–0.9)
Monocytes: 7 %
Neutrophils Absolute: 2.6 10*3/uL (ref 1.4–7.0)
Neutrophils: 43 %
Platelets: 303 10*3/uL (ref 150–450)
RBC: 5.25 x10E6/uL (ref 4.14–5.80)
RDW: 13.8 % (ref 11.6–15.4)
WBC: 6.1 10*3/uL (ref 3.4–10.8)

## 2021-03-19 LAB — COMPREHENSIVE METABOLIC PANEL
ALT: 64 IU/L — ABNORMAL HIGH (ref 0–44)
AST: 30 IU/L (ref 0–40)
Albumin/Globulin Ratio: 1.3 (ref 1.2–2.2)
Albumin: 4 g/dL (ref 3.8–4.9)
Alkaline Phosphatase: 101 IU/L (ref 44–121)
BUN/Creatinine Ratio: 9 — ABNORMAL LOW (ref 10–24)
BUN: 14 mg/dL (ref 8–27)
Bilirubin Total: 0.4 mg/dL (ref 0.0–1.2)
CO2: 26 mmol/L (ref 20–29)
Calcium: 9.7 mg/dL (ref 8.6–10.2)
Chloride: 102 mmol/L (ref 96–106)
Creatinine, Ser: 1.5 mg/dL — ABNORMAL HIGH (ref 0.76–1.27)
Globulin, Total: 3.1 g/dL (ref 1.5–4.5)
Glucose: 106 mg/dL — ABNORMAL HIGH (ref 70–99)
Potassium: 4.2 mmol/L (ref 3.5–5.2)
Sodium: 142 mmol/L (ref 134–144)
Total Protein: 7.1 g/dL (ref 6.0–8.5)
eGFR: 53 mL/min/{1.73_m2} — ABNORMAL LOW (ref >59)

## 2021-03-19 LAB — LIPID PANEL
Chol/HDL Ratio: 3.9 ratio (ref 0.0–5.0)
Cholesterol, Total: 129 mg/dL (ref 100–199)
HDL: 33 mg/dL — ABNORMAL LOW (ref >39)
LDL Chol Calc (NIH): 56 mg/dL (ref 0–99)
Triglycerides: 248 mg/dL — ABNORMAL HIGH (ref 0–149)
VLDL Cholesterol Cal: 40 mg/dL (ref 5–40)

## 2021-03-19 LAB — TSH+FREE T4
Free T4: 1.16 ng/dL (ref 0.82–1.77)
TSH: 3.04 u[IU]/mL (ref 0.450–4.500)

## 2021-03-20 ENCOUNTER — Other Ambulatory Visit: Payer: Self-pay | Admitting: Family Medicine

## 2021-03-20 ENCOUNTER — Telehealth: Payer: Self-pay

## 2021-03-20 DIAGNOSIS — N1831 Chronic kidney disease, stage 3a: Secondary | ICD-10-CM

## 2021-03-20 MED ORDER — DAPAGLIFLOZIN PROPANEDIOL 10 MG PO TABS: 10.0000 mg | ORAL_TABLET | Freq: Every day | ORAL | 11 refills | Status: DC

## 2021-03-20 NOTE — Telephone Encounter (Signed)
-----   Message from Gwyneth Sprout, FNP sent at 03/19/2021  8:07 AM EDT ----- ?Please call and provide results and thank patient for coming back as there was confusion the other day after he provided urine sample and made f/u appt ? ?Blood chemistry  ?-continues to show slight elevation in glucose ?-elevated kidney clearance, kidneys are not clearing waste well ?-GFR is 53--> could start an additional medication to assist and protect kidney, ex farxiga. Please let us know if you are willing to start. ?-elevated liver enzyme, ALT- can come from meds, ex NSAIDs like aleve, advil, use of ETOH ? ?Lipid panel ?-good total, <200 ?-fats, triglycerides remain elevated; we recommend diet low in saturated fat and regular exercise - 30 min at least 5 times per week ?-hdl/good cholesterol is low; Recommend increase in diet of healthier fat choices- low fat meats, oils that are not solid at room temperature, nuts, seeds, fish- cod, halibut, salmon, and avocado. Exercise can also increase this number.  Supplemental omega 3's can be taken as well but are not as helpful as dietary/exercise changes. ?-ldl is at range <70 ? ?Cell count stable ?-no anemia ?-no infection ? ?Normal, stable thyroid ? ?Thanks Daneil Dan FNP ?

## 2021-03-20 NOTE — Telephone Encounter (Signed)
Patient advised of labs and has agreed to start [place].  ?

## 2021-03-27 ENCOUNTER — Other Ambulatory Visit: Payer: Self-pay | Admitting: Family Medicine

## 2021-03-27 DIAGNOSIS — R7303 Prediabetes: Secondary | ICD-10-CM

## 2021-03-27 DIAGNOSIS — E119 Type 2 diabetes mellitus without complications: Secondary | ICD-10-CM

## 2021-04-15 ENCOUNTER — Other Ambulatory Visit: Payer: Self-pay

## 2021-04-15 ENCOUNTER — Telehealth: Payer: Self-pay | Admitting: Family Medicine

## 2021-04-15 MED ORDER — LEVOTHYROXINE SODIUM 25 MCG PO TABS: ORAL_TABLET | ORAL | 1 refills | Status: DC

## 2021-04-15 NOTE — Telephone Encounter (Signed)
[facility] faxed refill request for the following medications: ? ?levothyroxine (SYNTHROID) 25 MCG tablet  ? ? ?Please advise ? ?

## 2021-05-04 ENCOUNTER — Other Ambulatory Visit: Payer: Self-pay | Admitting: Family Medicine

## 2021-05-04 DIAGNOSIS — I10 Essential (primary) hypertension: Secondary | ICD-10-CM

## 2021-05-20 ENCOUNTER — Telehealth: Payer: Self-pay

## 2021-05-20 ENCOUNTER — Other Ambulatory Visit: Payer: Self-pay

## 2021-05-20 DIAGNOSIS — Z86010 Personal history of colonic polyps: Secondary | ICD-10-CM

## 2021-05-20 MED ORDER — NA SULFATE-K SULFATE-MG SULF 17.5-3.13-1.6 GM/177ML PO SOLN: 1.0000 | ORAL | 0 refills | Status: AC

## 2021-05-20 NOTE — Telephone Encounter (Signed)
Gastroenterology Pre-Procedure Review ? ?Request Date: 06/24/21 ?Requesting Physician: Dr. Allegra Lai ? ?PATIENT REVIEW QUESTIONS: The patient responded to the following health history questions as indicated:   ? ?1. Are you having any GI issues? no ?2. Do you have a personal history of Polyps? yes (05/25/20 performed by Dr. Maximino Greenland) 2 day prep was recommended by Dr. Maximino Greenland for repeat colonoscopy ?3. Do you have a family history of Colon Cancer or Polyps? no ?4. Diabetes Mellitus? no ?5. Joint replacements in the past 12 months?no ?6. Major health problems in the past 3 months?no ?7. Any artificial heart valves, MVP, or defibrillator?no ?   ?MEDICATIONS & ALLERGIES:    ?Patient reports the following regarding taking any anticoagulation/antiplatelet therapy:   ?Plavix, Coumadin, Eliquis, Xarelto, Lovenox, Pradaxa, Brilinta, or Effient? no ?Aspirin? no ? ?Patient confirms/reports the following medications:  ?Current Outpatient Medications  ?Medication Sig Dispense Refill  ? dapagliflozin propanediol (FARXIGA) 10 MG TABS tablet Take 1 tablet (10 mg total) by mouth daily before breakfast. 30 tablet 11  ? atorvastatin (LIPITOR) 80 MG tablet Take 1 tablet (80 mg total) by mouth daily. 90 tablet 3  ? levothyroxine (SYNTHROID) 25 MCG tablet TAKE 1 TABLET (25 MCG TOTAL) BY MOUTH DAILY-- BEFORE BREAKFAST. 90 tablet 1  ? losartan-hydrochlorothiazide (HYZAAR) 50-12.5 MG tablet TAKE 1 TABLET BY MOUTH EVERY DAY 30 tablet 2  ? metFORMIN (GLUCOPHAGE) 500 MG tablet TAKE 1 TABLET BY MOUTH EVERY DAY WITH BREAKFAST 60 tablet 1  ? verapamil (CALAN) 120 MG tablet TAKE 2 TABLETS (240 MG TOTAL) BY MOUTH DAILY. 180 tablet 1  ? ?No current facility-administered medications for this visit.  ? ? ?Patient confirms/reports the following allergies:  ?Allergies  ?Allergen Reactions  ? Amlodipine Itching  ? ? ?No orders of the defined types were placed in this encounter. ? ? ?AUTHORIZATION INFORMATION ?Primary Insurance: ?1D#: ?Group  #: ? ?Secondary Insurance: ?1D#: ?Group #: ? ?SCHEDULE INFORMATION: ?Date: 06/24/21 ?Time: ?Location: [facility] ?

## 2021-05-31 ENCOUNTER — Other Ambulatory Visit: Payer: Self-pay | Admitting: Family Medicine

## 2021-05-31 DIAGNOSIS — I10 Essential (primary) hypertension: Secondary | ICD-10-CM

## 2021-06-20 ENCOUNTER — Telehealth: Payer: Self-pay

## 2021-06-20 MED ORDER — NA SULFATE-K SULFATE-MG SULF 17.5-3.13-1.6 GM/177ML PO SOLN: 1.0000 | Freq: Once | ORAL | 0 refills | Status: AC

## 2021-06-20 NOTE — Telephone Encounter (Signed)
Sent in prep for patient  

## 2021-06-22 ENCOUNTER — Other Ambulatory Visit: Payer: Self-pay | Admitting: Family Medicine

## 2021-06-22 DIAGNOSIS — R7303 Prediabetes: Secondary | ICD-10-CM

## 2021-06-22 DIAGNOSIS — E119 Type 2 diabetes mellitus without complications: Secondary | ICD-10-CM

## 2021-06-24 ENCOUNTER — Ambulatory Visit
Admission: RE | Admit: 2021-06-24 | Discharge: 2021-06-24 | Disposition: A | Discharge status: Home / Self Care | Payer: Medicare Other | Attending: Gastroenterology | Admitting: Gastroenterology

## 2021-06-24 ENCOUNTER — Encounter
Admission: RE | Disposition: A | Discharge status: Home / Self Care | Payer: Self-pay | Source: Home / Self Care | Attending: Gastroenterology

## 2021-06-24 ENCOUNTER — Encounter: Payer: Self-pay | Admitting: Gastroenterology

## 2021-06-24 ENCOUNTER — Ambulatory Visit: Payer: Medicare Other | Admitting: Certified Registered Nurse Anesthetist

## 2021-06-24 DIAGNOSIS — M1712 Unilateral primary osteoarthritis, left knee: Secondary | ICD-10-CM | POA: Diagnosis not present

## 2021-06-24 DIAGNOSIS — Z86010 Personal history of colonic polyps: Secondary | ICD-10-CM

## 2021-06-24 DIAGNOSIS — Z6830 Body mass index (BMI) 30.0-30.9, adult: Secondary | ICD-10-CM | POA: Diagnosis not present

## 2021-06-24 DIAGNOSIS — E1022 Type 1 diabetes mellitus with diabetic chronic kidney disease: Secondary | ICD-10-CM | POA: Insufficient documentation

## 2021-06-24 DIAGNOSIS — E039 Hypothyroidism, unspecified: Secondary | ICD-10-CM | POA: Diagnosis not present

## 2021-06-24 DIAGNOSIS — I129 Hypertensive chronic kidney disease with stage 1 through stage 4 chronic kidney disease, or unspecified chronic kidney disease: Secondary | ICD-10-CM | POA: Insufficient documentation

## 2021-06-24 DIAGNOSIS — K644 Residual hemorrhoidal skin tags: Secondary | ICD-10-CM | POA: Diagnosis not present

## 2021-06-24 DIAGNOSIS — Z7984 Long term (current) use of oral hypoglycemic drugs: Secondary | ICD-10-CM | POA: Diagnosis not present

## 2021-06-24 DIAGNOSIS — E669 Obesity, unspecified: Secondary | ICD-10-CM | POA: Diagnosis not present

## 2021-06-24 DIAGNOSIS — K649 Unspecified hemorrhoids: Secondary | ICD-10-CM | POA: Diagnosis not present

## 2021-06-24 DIAGNOSIS — Z1211 Encounter for screening for malignant neoplasm of colon: Secondary | ICD-10-CM

## 2021-06-24 DIAGNOSIS — Z833 Family history of diabetes mellitus: Secondary | ICD-10-CM | POA: Insufficient documentation

## 2021-06-24 DIAGNOSIS — Z8249 Family history of ischemic heart disease and other diseases of the circulatory system: Secondary | ICD-10-CM | POA: Diagnosis not present

## 2021-06-24 DIAGNOSIS — N189 Chronic kidney disease, unspecified: Secondary | ICD-10-CM | POA: Insufficient documentation

## 2021-06-24 DIAGNOSIS — Z79899 Other long term (current) drug therapy: Secondary | ICD-10-CM | POA: Insufficient documentation

## 2021-06-24 HISTORY — PX: COLONOSCOPY WITH PROPOFOL: SHX5780

## 2021-06-24 LAB — GLUCOSE, CAPILLARY: Glucose-Capillary: 104 mg/dL — ABNORMAL HIGH (ref 70–99)

## 2021-06-24 SURGERY — COLONOSCOPY WITH PROPOFOL
Anesthesia: General

## 2021-06-24 MED ORDER — PROPOFOL 1000 MG/100ML IV EMUL
INTRAVENOUS | Status: AC
  Filled 2021-06-24: qty 400

## 2021-06-24 MED ORDER — GLYCOPYRROLATE 0.2 MG/ML IJ SOLN
INTRAMUSCULAR | Status: AC
  Filled 2021-06-24: qty 1

## 2021-06-24 MED ORDER — PROPOFOL 10 MG/ML IV BOLUS
INTRAVENOUS | Status: DC | PRN
  Administered 2021-06-24: 70 mg via INTRAVENOUS

## 2021-06-24 MED ORDER — GLYCOPYRROLATE 0.2 MG/ML IJ SOLN
INTRAMUSCULAR | Status: DC | PRN
  Administered 2021-06-24: .2 mg via INTRAVENOUS

## 2021-06-24 MED ORDER — PROPOFOL 1000 MG/100ML IV EMUL
INTRAVENOUS | Status: AC
  Filled 2021-06-24: qty 100

## 2021-06-24 MED ORDER — PROPOFOL 500 MG/50ML IV EMUL
INTRAVENOUS | Status: DC | PRN
  Administered 2021-06-24: 150 ug/kg/min via INTRAVENOUS

## 2021-06-24 MED ORDER — SODIUM CHLORIDE 0.9 % IV SOLN: INTRAVENOUS | Status: DC

## 2021-06-24 NOTE — Anesthesia Preprocedure Evaluation (Signed)
Anesthesia Evaluation  Patient identified by MRN, date of birth, ID band Patient awake    Reviewed: Allergy & Precautions, H&P , NPO status , Patient's Chart, lab work & pertinent test results, reviewed documented beta blocker date and time   Airway Mallampati: II   Neck ROM: full    Dental  (+) Poor Dentition   Pulmonary neg pulmonary ROS,    Pulmonary exam normal        Cardiovascular Exercise Tolerance: Poor hypertension, On Medications negative cardio ROS Normal cardiovascular exam Rhythm:regular Rate:Normal     Neuro/Psych negative neurological ROS  negative psych ROS   GI/Hepatic negative GI ROS, Neg liver ROS,   Endo/Other  diabetes, Well Controlled, Type 1, Insulin Dependent, Oral Hypoglycemic AgentsHypothyroidism   Renal/GU CRFRenal disease  negative genitourinary   Musculoskeletal   Abdominal   Peds  Hematology negative hematology ROS (+)   Anesthesia Other Findings Past Medical History: No date: Cervical paraspinous muscle spasm No date: Diabetes mellitus without complication (HCC) No date: Hypertension No date: Hypothyroidism No date: Obesity No date: Osteoarthritis     Comment:  left knee Past Surgical History: 05/25/2020: COLONOSCOPY WITH PROPOFOL; N/A     Comment:  Procedure: COLONOSCOPY WITH PROPOFOL;  Surgeon:               Pasty Spillers, MD;  Location: [facility] ENDOSCOPY;                Service: Endoscopy;  Laterality: N/A; No date: KNEE SURGERY; Left     Comment:  x's 2   Reproductive/Obstetrics negative OB ROS                             Anesthesia Physical Anesthesia Plan  ASA: 3  Anesthesia Plan: General   Post-op Pain Management:    Induction:   PONV Risk Score and Plan:   Airway Management Planned:   Additional Equipment:   Intra-op Plan:   Post-operative Plan:   Informed Consent: I have reviewed the patients History and Physical,  chart, labs and discussed the procedure including the risks, benefits and alternatives for the proposed anesthesia with the patient or authorized representative who has indicated his/her understanding and acceptance.     Dental Advisory Given  Plan Discussed with: CRNA  Anesthesia Plan Comments:         Anesthesia Quick Evaluation

## 2021-06-24 NOTE — Op Note (Signed)
[facility] Patient Name: Francis Gomez Procedure Date: 06/24/2021 6:56 AM MRN: [id number] Account #: [id number] Date of Birth: 1960/10/18 Admit Type: Outpatient Age: 61 Room: [facility] ENDO ROOM 4 Gender: Male Note Status: Finalized Instrument Name: Colonoscope [id number] Procedure:             Colonoscopy Indications:           Screening for colorectal malignant neoplasm Providers:             Toney Reil MD, MD Referring MD:          Daryl Eastern. Suzie Portela (Referring MD) Medicines:             General Anesthesia Complications:         No immediate complications. Estimated blood loss: None. Procedure:             Pre-Anesthesia Assessment:                        - Prior to the procedure, a History and Physical was                         performed, and patient medications and allergies were                         reviewed. The patient is competent. The risks and                         benefits of the procedure and the sedation options and                         risks were discussed with the patient. All questions                         were answered and informed consent was obtained.                         Patient identification and proposed procedure were                         verified by the physician, the nurse, the                         anesthesiologist, the anesthetist and the technician                         in the pre-procedure area in the procedure room in the                         endoscopy suite. Mental Status Examination: alert and                         oriented. Airway Examination: normal oropharyngeal                         airway and neck mobility. Respiratory Examination:                         clear to auscultation. CV Examination: normal.  Prophylactic Antibiotics: The patient does not require                         prophylactic antibiotics. Prior Anticoagulants: The                          patient has taken no previous anticoagulant or                         antiplatelet agents. ASA Grade Assessment: III - A                         patient with severe systemic disease. After reviewing                         the risks and benefits, the patient was deemed in                         satisfactory condition to undergo the procedure. The                         anesthesia plan was to use general anesthesia.                         Immediately prior to administration of medications,                         the patient was re-assessed for adequacy to receive                         sedatives. The heart rate, respiratory rate, oxygen                         saturations, blood pressure, adequacy of pulmonary                         ventilation, and response to care were monitored                         throughout the procedure. The physical status of the                         patient was re-assessed after the procedure.                        After obtaining informed consent, the colonoscope was                         passed under direct vision. Throughout the procedure,                         the patient's blood pressure, pulse, and oxygen                         saturations were monitored continuously. The                         Colonoscope was introduced through the anus and  advanced to the the cecum, identified by appendiceal                         orifice and ileocecal valve. The colonoscopy was                         performed without difficulty. The patient tolerated                         the procedure well. The quality of the bowel                         preparation was evaluated using the BBPS [facility] Bowel                         Preparation Scale) with scores of: Right Colon = 3,                         Transverse Colon = 3 and Left Colon = 3 (entire mucosa                         seen well with no residual staining, small fragments                          of stool or opaque liquid). The total BBPS score                         equals 9. Findings:      The perianal and digital rectal examinations were normal. Pertinent       negatives include normal sphincter tone and no palpable rectal lesions.      Non-bleeding external hemorrhoids were found during retroflexion. The       hemorrhoids were medium-sized.      The colon (entire examined portion) appeared normal. Impression:            - Non-bleeding external hemorrhoids.                        - The entire examined colon is normal.                        - No specimens collected. Recommendation:        - Discharge patient to home (with escort).                        - Resume previous diet today.                        - Continue present medications.                        - Repeat colonoscopy in 10 years for screening                         purposes with 2 day prep. Procedure Code(s):     --- Professional ---                        [id number], Colorectal cancer screening; colonoscopy on  individual not meeting criteria for high risk Diagnosis Code(s):     --- Professional ---                        Z12.11, Encounter for screening for malignant neoplasm                         of colon                        K64.4, Residual hemorrhoidal skin tags CPT copyright 2019 American Medical Association. All rights reserved. The codes documented in this report are preliminary and upon coder review may  be revised to meet current compliance requirements. Dr. Libby Maw Toney Reil MD, MD 06/24/2021 8:09:00 AM This report has been signed electronically. Number of Addenda: 0 Note Initiated On: 06/24/2021 6:56 AM Scope Withdrawal Time: 0 hours 10 minutes 6 seconds  Total Procedure Duration: 0 hours 18 minutes 43 seconds  Estimated Blood Loss:  Estimated blood loss: none.      [facility]

## 2021-06-24 NOTE — H&P (Signed)
Arlyss Repress, MD [address]  [address]  [place], [place] [address]  Main: [phone]  Fax: [phone] Pager: [phone]  Primary Care Physician:  Jacky Kindle, FNP Primary Gastroenterologist:  Dr. Arlyss Repress  Pre-Procedure History & Physical: HPI:  Francis Gomez is a 61 y.o. male is here for an colonoscopy.   Past Medical History:  Diagnosis Date   Cervical paraspinous muscle spasm    Diabetes mellitus without complication (HCC)    Hypertension    Hypothyroidism    Obesity    Osteoarthritis    left knee    Past Surgical History:  Procedure Laterality Date   COLONOSCOPY WITH PROPOFOL N/A 05/25/2020   Procedure: COLONOSCOPY WITH PROPOFOL;  Surgeon: Pasty Spillers, MD;  Location: [facility] ENDOSCOPY;  Service: Endoscopy;  Laterality: N/A;   KNEE SURGERY Left    x's 2    Prior to Admission medications   Medication Sig Start Date End Date Taking? Authorizing Provider  atorvastatin (LIPITOR) 80 MG tablet Take 1 tablet (80 mg total) by mouth daily. 03/18/21  Yes Jacky Kindle, FNP  dapagliflozin propanediol (FARXIGA) 10 MG TABS tablet Take 1 tablet (10 mg total) by mouth daily before breakfast. 03/20/21  Yes Merita Norton T, FNP  levothyroxine (SYNTHROID) 25 MCG tablet TAKE 1 TABLET (25 MCG TOTAL) BY MOUTH DAILY-- BEFORE BREAKFAST. 04/15/21  Yes Merita Norton T, FNP  losartan-hydrochlorothiazide (HYZAAR) 50-12.5 MG tablet TAKE 1 TABLET BY MOUTH EVERY DAY 05/31/21  Yes Merita Norton T, FNP  metFORMIN (GLUCOPHAGE) 500 MG tablet TAKE 1 TABLET BY MOUTH EVERY DAY WITH BREAKFAST 03/28/21  Yes Jacky Kindle, FNP  verapamil (CALAN) 120 MG tablet TAKE 2 TABLETS (240 MG TOTAL) BY MOUTH DAILY. 05/06/21  Yes Merita Norton T, FNP    Allergies as of 05/20/2021 - Review Complete 03/13/2021  Allergen Reaction Noted   Amlodipine Itching 02/12/2015    Family History  Problem Relation Age of Onset   Diabetes Mother    Heart disease Father 79       MI   Heart disease Sister      Social History   Socioeconomic History   Marital status: Single    Spouse name: Not on file   Number of children: Not on file   Years of education: Not on file   Highest education level: Not on file  Occupational History   Not on file  Tobacco Use   Smoking status: Never   Smokeless tobacco: Never  Vaping Use   Vaping Use: Never used  Substance and Sexual Activity   Alcohol use: No   Drug use: No   Sexual activity: Not on file  Other Topics Concern   Not on file  Social History Narrative   Not on file   Social Determinants of Health   Financial Resource Strain: Not on file  Food Insecurity: Not on file  Transportation Needs: Not on file  Physical Activity: Not on file  Stress: Not on file  Social Connections: Not on file  Intimate Partner Violence: Not on file    Review of Systems: See HPI, otherwise negative ROS  Physical Exam: BP 137/77   Pulse 89   Temp 98.6 F (37 C) (Temporal)   Resp 18   Ht 5\' 10"  (1.778 m)   Wt 97.1 kg   SpO2 98%   BMI 30.71 kg/m  General:   Alert,  pleasant and cooperative in NAD Head:  Normocephalic and atraumatic. Neck:  Supple; no masses  or thyromegaly. Lungs:  Clear throughout to auscultation.    Heart:  Regular rate and rhythm. Abdomen:  Soft, nontender and nondistended. Normal bowel sounds, without guarding, and without rebound.   Neurologic:  Alert and  oriented x4;  grossly normal neurologically.  Impression/Plan: Elwyn Klosinski is here for an colonoscopy to be performed for colon cancer screening  Risks, benefits, limitations, and alternatives regarding  colonoscopy have been reviewed with the patient.  Questions have been answered.  All parties agreeable.   Lannette Donath, MD  06/24/2021, 7:41 AM

## 2021-06-24 NOTE — Transfer of Care (Signed)
Immediate Anesthesia Transfer of Care Note  Patient: Francis Gomez  Procedure(s) Performed: COLONOSCOPY WITH PROPOFOL  Patient Location: PACU  Anesthesia Type:General  Level of Consciousness: awake and alert   Airway & Oxygen Therapy: Patient Spontanous Breathing and Patient connected to nasal cannula oxygen  Post-op Assessment: Report given to RN and Post -op Vital signs reviewed and stable  Post vital signs: Reviewed   Last Vitals:  Vitals Value Taken Time  BP 101/68 06/24/21 0812  Temp 36.1 C 06/24/21 0811  Pulse 74 06/24/21 0815  Resp 13 06/24/21 0815  SpO2 93 % 06/24/21 0815  Vitals shown include unvalidated device data.  Last Pain:  Vitals:   06/24/21 0811  TempSrc: Tympanic  PainSc: Asleep         Complications: No notable events documented.

## 2021-06-24 NOTE — Anesthesia Postprocedure Evaluation (Signed)
Anesthesia Post Note  Patient: Francis Gomez  Procedure(s) Performed: COLONOSCOPY WITH PROPOFOL  Patient location during evaluation: PACU Anesthesia Type: General Level of consciousness: awake and alert Pain management: pain level controlled Vital Signs Assessment: post-procedure vital signs reviewed and stable Respiratory status: spontaneous breathing, nonlabored ventilation, respiratory function stable and patient connected to nasal cannula oxygen Cardiovascular status: blood pressure returned to baseline and stable Postop Assessment: no apparent nausea or vomiting Anesthetic complications: no   No notable events documented.   Last Vitals:  Vitals:   06/24/21 0821 06/24/21 0831  BP: 105/77 116/80  Pulse: 76 73  Resp: 20 (!) 22  Temp:    SpO2: 96% 96%    Last Pain:  Vitals:   06/24/21 0831  TempSrc:   PainSc: 0-No pain                 Yevette Edwards

## 2021-06-24 NOTE — Telephone Encounter (Signed)
Requested Prescriptions  Pending Prescriptions Disp Refills  . metFORMIN (GLUCOPHAGE) 500 MG tablet [Pharmacy Med Name: METFORMIN HCL 500 MG TABLET] 90 tablet 1    Sig: TAKE 1 TABLET BY MOUTH [place]     Endocrinology:  Diabetes - Biguanides Failed - 06/22/2021  2:16 AM      Failed - Cr in normal range and within 360 days    Creatinine, Ser  Date Value Ref Range Status  03/18/2021 1.50 (H) 0.76 - 1.27 mg/dL Final   Creatinine, POC  Date Value Ref Range Status  01/20/2017 NA mg/dL Final         Failed - eGFR in normal range and within 360 days    GFR calc Af Amer  Date Value Ref Range Status  02/14/2019 69 >59 mL/min/1.73 Final   GFR calc non Af Amer  Date Value Ref Range Status  02/14/2019 60 >59 mL/min/1.73 Final   eGFR  Date Value Ref Range Status  03/18/2021 53 (L) >59 mL/min/1.73 Final         Failed - B12 Level in normal range and within 720 days    No results found for: "VITAMINB12"       Passed - HBA1C is between 0 and 7.9 and within 180 days    Hemoglobin A1C  Date Value Ref Range Status  03/13/2021 6.3 (A) 4.0 - 5.6 % Final  10/16/2015 6.2  Final   Hgb A1c MFr Bld  Date Value Ref Range Status  07/13/2020 6.2 (H) 4.8 - 5.6 % Final    Comment:             Prediabetes: 5.7 - 6.4          Diabetes: >6.4          Glycemic control for adults with diabetes: <7.0          Passed - Valid encounter within last 6 months    Recent Outpatient Visits          3 months ago 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, without long-term current use of insulin ([place])   [facility] Tally Joe T, FNP   7 months ago Diabetes mellitus without complication [facility])   [facility] Jerrol Banana., MD   11 months ago Essential hypertension   [employer], Vickki Muff, PA-C   1 year ago Primary hypertension   [facility], Laurita Quint, FNP   1 year  ago Prediabetes   [employer], Clearnce Sorrel, PA-C      Future Appointments            In 2 months Gwyneth Sprout, FNP [employer], [facility]           Passed - CBC within normal limits and completed in the last 12 months    WBC  Date Value Ref Range Status  03/18/2021 6.1 3.4 - 10.8 x10E3/uL Final   RBC  Date Value Ref Range Status  03/18/2021 5.25 4.14 - 5.80 x10E6/uL Final   Hemoglobin  Date Value Ref Range Status  03/18/2021 14.4 13.0 - 17.7 g/dL Final   Hematocrit  Date Value Ref Range Status  03/18/2021 44.4 37.5 - 51.0 % Final   MCHC  Date Value Ref Range Status  03/18/2021 32.4 31.5 - 35.7 g/dL Final   [facility]  Date Value Ref Range Status  03/18/2021 27.4 26.6 - 33.0 pg Final  MCV  Date Value Ref Range Status  03/18/2021 85 79 - 97 fL Final   No results found for: "PLTCOUNTKUC", "LABPLAT", "POCPLA" RDW  Date Value Ref Range Status  03/18/2021 13.8 11.6 - 15.4 % Final

## 2021-06-25 ENCOUNTER — Encounter: Payer: Self-pay | Admitting: Gastroenterology

## 2021-09-12 NOTE — Progress Notes (Signed)
Complete physical exam   Patient: Francis Gomez   DOB: [birth year]   61 y.o. Male  MRN: [id number] Visit Date: 09/13/2021  Today's healthcare provider: Gwyneth Sprout, FNP  RE Introduced to nurse practitioner role and practice setting.  All questions answered.  Discussed provider/patient relationship and expectations.   I,Tiffany J Bragg,acting as a scribe for Gwyneth Sprout, FNP.,have documented all relevant documentation on the behalf of Gwyneth Sprout, FNP,as directed by  Gwyneth Sprout, FNP while in the presence of Gwyneth Sprout, FNP.   Chief Complaint  Patient presents with   Annual Exam   Subjective    Francis Gomez is a 61 y.o. male who presents today for a complete physical exam.  He reports consuming a general diet. Home exercise routine includes walking every morning. He generally feels well. He reports sleeping well. He does have additional problems to discuss today. States he wakes up nauseous lately.  HPI    Past Medical History:  Diagnosis Date   Cervical paraspinous muscle spasm    Diabetes mellitus without complication ([place])    Hypertension    Hypothyroidism    Obesity    Osteoarthritis    left knee   Past Surgical History:  Procedure Laterality Date   COLONOSCOPY WITH PROPOFOL N/A 05/25/2020   Procedure: COLONOSCOPY WITH PROPOFOL;  Surgeon: Virgel Manifold, MD;  Location: [facility] ENDOSCOPY;  Service: Endoscopy;  Laterality: N/A;   COLONOSCOPY WITH PROPOFOL N/A 06/24/2021   Procedure: COLONOSCOPY WITH PROPOFOL;  Surgeon: Lin Landsman, MD;  Location: [facility] ENDOSCOPY;  Service: Gastroenterology;  Laterality: N/A;   KNEE SURGERY Left    x's 2   Social History   Socioeconomic History   Marital status: Single    Spouse name: Not on file   Number of children: Not on file   Years of education: Not on file   Highest education level: Not on file  Occupational History   Not on file  Tobacco Use   Smoking status: Never   Smokeless tobacco:  Never  Vaping Use   Vaping Use: Never used  Substance and Sexual Activity   Alcohol use: No   Drug use: No   Sexual activity: Not on file  Other Topics Concern   Not on file  Social History Narrative   Not on file   Social Determinants of Health   Financial Resource Strain: Not on file  Food Insecurity: Not on file  Transportation Needs: Not on file  Physical Activity: Not on file  Stress: Not on file  Social Connections: Not on file  Intimate Partner Violence: Not on file   Family Status  Relation Name Status   Mother  Alive   Father  Deceased   Sister  Alive   Brother  Alive   Sister  Alive   Sister  Alive   Brother  Alive   Family History  Problem Relation Age of Onset   Diabetes Mother    Heart disease Father 25       MI   Heart disease Sister    Allergies  Allergen Reactions   Amlodipine Itching    Patient Care Team: Gwyneth Sprout, FNP as PCP - General (Family Medicine)   Medications: Outpatient Medications Prior to Visit  Medication Sig   atorvastatin (LIPITOR) 80 MG tablet Take 1 tablet (80 mg total) by mouth daily.   dapagliflozin propanediol (FARXIGA) 10 MG TABS tablet Take 1 tablet (10 mg total) by mouth  daily before breakfast.   levothyroxine (SYNTHROID) 25 MCG tablet TAKE 1 TABLET (25 MCG TOTAL) BY MOUTH DAILY-- BEFORE BREAKFAST.   losartan-hydrochlorothiazide (HYZAAR) 50-12.5 MG tablet TAKE 1 TABLET BY MOUTH EVERY DAY   metFORMIN (GLUCOPHAGE) 500 MG tablet TAKE 1 TABLET BY MOUTH EVERY DAY WITH BREAKFAST   verapamil (CALAN) 120 MG tablet TAKE 2 TABLETS (240 MG TOTAL) BY MOUTH DAILY.   No facility-administered medications prior to visit.    Review of Systems  Last CBC Lab Results  Component Value Date   WBC 6.1 03/18/2021   HGB 14.4 03/18/2021   HCT 44.4 03/18/2021   MCV 85 03/18/2021   MCH 27.4 03/18/2021   RDW 13.8 03/18/2021   PLT 303 42/68/3419   Last metabolic panel Lab Results  Component Value Date   GLUCOSE 106 (H)  03/18/2021   NA 142 03/18/2021   K 4.2 03/18/2021   CL 102 03/18/2021   CO2 26 03/18/2021   BUN 14 03/18/2021   CREATININE 1.50 (H) 03/18/2021   EGFR 53 (L) 03/18/2021   CALCIUM 9.7 03/18/2021   PROT 7.1 03/18/2021   ALBUMIN 4.0 03/18/2021   LABGLOB 3.1 03/18/2021   AGRATIO 1.3 03/18/2021   BILITOT 0.4 03/18/2021   ALKPHOS 101 03/18/2021   AST 30 03/18/2021   ALT 64 (H) 03/18/2021   Last lipids Lab Results  Component Value Date   CHOL 129 03/18/2021   HDL 33 (L) 03/18/2021   LDLCALC 56 03/18/2021   TRIG 248 (H) 03/18/2021   CHOLHDL 3.9 03/18/2021   Last hemoglobin A1c Lab Results  Component Value Date   HGBA1C 6.3 (A) 03/13/2021   Last thyroid functions Lab Results  Component Value Date   TSH 3.040 03/18/2021   T4TOTAL 7.0 02/14/2019     Objective     BP 120/80 (BP Location: Right Arm, Patient Position: Sitting, Cuff Size: Large)   Pulse 82   Temp 98.2 F (36.8 C) (Oral)   Resp 16   Ht 5' 10" (1.778 m)   Wt 208 lb (94.3 kg)   SpO2 98%   BMI 29.84 kg/m   BP Readings from Last 3 Encounters:  09/13/21 120/80  06/24/21 116/80  03/13/21 116/81   Wt Readings from Last 3 Encounters:  09/13/21 208 lb (94.3 kg)  06/24/21 214 lb (97.1 kg)  03/13/21 214 lb 6.4 oz (97.3 kg)   SpO2 Readings from Last 3 Encounters:  09/13/21 98%  06/24/21 96%  11/14/20 99%   Physical Exam Vitals and nursing note reviewed.  Constitutional:      General: He is awake. He is not in acute distress.    Appearance: Normal appearance. He is well-developed, well-groomed and overweight. He is not ill-appearing, toxic-appearing or diaphoretic.  HENT:     Head: Normocephalic and atraumatic.     Jaw: There is normal jaw occlusion. No trismus, tenderness, swelling or pain on movement.     Salivary Glands: Right salivary gland is not diffusely enlarged or tender. Left salivary gland is not diffusely enlarged or tender.     Right Ear: Hearing, tympanic membrane, ear canal and external  ear normal. There is no impacted cerumen.     Left Ear: Hearing, tympanic membrane, ear canal and external ear normal. There is no impacted cerumen.     Nose: Nose normal. No congestion or rhinorrhea.     Right Turbinates: Not enlarged, swollen or pale.     Left Turbinates: Not enlarged, swollen or pale.     Right Sinus:  No maxillary sinus tenderness or frontal sinus tenderness.     Left Sinus: No maxillary sinus tenderness or frontal sinus tenderness.     Mouth/Throat:     Lips: Pink.     Mouth: Mucous membranes are moist. No injury, lacerations, oral lesions or angioedema.     Pharynx: Oropharynx is clear. Uvula midline. No pharyngeal swelling, oropharyngeal exudate or posterior oropharyngeal erythema.     Tonsils: No tonsillar exudate or tonsillar abscesses.  Eyes:     General: Lids are normal. Vision grossly intact. Gaze aligned appropriately.        Right eye: No discharge.        Left eye: No discharge.     Extraocular Movements: Extraocular movements intact.     Conjunctiva/sclera: Conjunctivae normal.     Pupils: Pupils are equal, round, and reactive to light.  Neck:     Thyroid: No thyroid mass, thyromegaly or thyroid tenderness.     Vascular: No carotid bruit.     Trachea: Trachea normal. No tracheal tenderness.  Cardiovascular:     Rate and Rhythm: Normal rate and regular rhythm.     Pulses: Normal pulses.          Carotid pulses are 2+ on the right side and 2+ on the left side.      Radial pulses are 2+ on the right side and 2+ on the left side.       Femoral pulses are 2+ on the right side and 2+ on the left side.      Popliteal pulses are 2+ on the right side and 2+ on the left side.       Dorsalis pedis pulses are 2+ on the right side and 2+ on the left side.       Posterior tibial pulses are 2+ on the right side and 2+ on the left side.     Heart sounds: Normal heart sounds, S1 normal and S2 normal. No murmur heard.    No friction rub. No gallop.  Pulmonary:      Effort: Pulmonary effort is normal. No respiratory distress.     Breath sounds: Normal breath sounds and air entry. No stridor. No wheezing, rhonchi or rales.  Chest:     Chest wall: No tenderness.  Abdominal:     General: Abdomen is flat. Bowel sounds are normal. There is no distension.     Palpations: Abdomen is soft. There is no mass.     Tenderness: There is no abdominal tenderness. There is no guarding or rebound.     Hernia: No hernia is present.  Genitourinary:    Comments: Exam deferred; denies complaints Musculoskeletal:        General: No swelling, tenderness, deformity or signs of injury. Normal range of motion.     Cervical back: Normal range of motion and neck supple. No rigidity or tenderness.     Right lower leg: No edema.     Left lower leg: No edema.  Lymphadenopathy:     Cervical: No cervical adenopathy.     Right cervical: No superficial, deep or posterior cervical adenopathy.    Left cervical: No superficial, deep or posterior cervical adenopathy.  Skin:    General: Skin is warm and dry.     Capillary Refill: Capillary refill takes less than 2 seconds.     Coloration: Skin is not jaundiced or pale.     Findings: No bruising, erythema, lesion or rash.  Neurological:     General:  No focal deficit present.     Mental Status: He is alert and oriented to person, place, and time. Mental status is at baseline.     GCS: GCS eye subscore is 4. GCS verbal subscore is 5. GCS motor subscore is 6.     Sensory: Sensation is intact. No sensory deficit.     Motor: Motor function is intact. No weakness.     Coordination: Coordination is intact.     Gait: Gait is intact.  Psychiatric:        Attention and Perception: Attention and perception normal.        Mood and Affect: Mood and affect normal.        Speech: Speech normal.        Behavior: Behavior normal. Behavior is cooperative.        Thought Content: Thought content normal.        Cognition and Memory: Cognition  normal.        Judgment: Judgment normal.    Last depression screening scores    09/13/2021    8:42 AM 11/14/2020    8:35 AM 07/13/2020    8:25 AM  PHQ 2/9 Scores  PHQ - 2 Score 0 0 1  PHQ- 9 Score _0 Last fall risk screening    09/13/2021    8:42 AM  Fall Risk   Falls in the past year? 0  Number falls in past yr: 0  Injury with Fall? 0  Risk for fall due to : No Fall Risks  Follow up Falls evaluation completed   Last Audit-C alcohol use screening    09/13/2021    8:42 AM  Alcohol Use Disorder Test (AUDIT)  1. How often do you have a drink containing alcohol? 0  2. How many drinks containing alcohol do you have on a typical day when you are drinking? 0  3. How often do you have six or more drinks on one occasion? 0  AUDIT-C Score 0   A score of 3 or more in women, and 4 or more in men indicates increased risk for alcohol abuse, EXCEPT if all of the points are from question 1   No results found for any visits on 09/13/21.  Assessment & Plan    Routine Health Maintenance and Physical Exam  Exercise Activities and Dietary recommendations  Goals   None     Immunization History  Administered Date(s) Administered   Influenza,inj,Quad PF,6+ Mos 03/26/2018, 02/11/2019, 09/01/2019, 03/13/2021, 09/13/2021   Influenza-Unspecified 10/06/2016   MODERNA COVID-19 SARS-COV-2 PEDS BIVALENT BOOSTER 6Y-11Y 03/31/2019, 04/27/2019, 12/27/2019   PNEUMOCOCCAL CONJUGATE-20 11/14/2020   Tdap 12/29/2006, 03/26/2018    Health Maintenance  Topic Date Due   HEMOGLOBIN A1C  09/13/2021   Zoster Vaccines- Shingrix (1 of 2) 12/13/2021 (Originally 07/26/2010)   OPHTHALMOLOGY EXAM  02/21/2022   COLONOSCOPY (Pts 45-4yr Insurance coverage will need to be confirmed)  06/25/2022   FOOT EXAM  09/14/2022   TETANUS/TDAP  03/25/2028   INFLUENZA VACCINE  Completed   COVID-19 Vaccine  Completed   Hepatitis C Screening  Completed   HIV Screening  Completed   HPV VACCINES  Aged Out    Discussed  health benefits of physical activity, and encouraged him to engage in regular exercise appropriate for his age and condition.  Problem List Items Addressed This Visit       Cardiovascular and Mediastinum   Hypertension associated with diabetes ([facility]    Chronic, stable; at goal <130/<80  Denies CP Denies SOB/ DOE Denies low blood pressure/hypotension Denies vision changes No LE Edema noted on exam Continue medication, Hyzaar 50-12.5 Denies side effects Repeat CMP and CBC Seek emergent care if you develop chest pain or chest pressure       Relevant Orders   CBC with Differential/Platelet   Comprehensive Metabolic Panel (CMET)     Digestive   Gastroesophageal reflux disease without esophagitis    Reports morning nausea; hx of colon polyps Trial of PPI to assist RTC if symptoms do not improve       Relevant Medications   omeprazole (PRILOSEC) 40 MG capsule     Endocrine   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 ratio   Hyperlipidemia associated with type 2 diabetes mellitus (HCC)    Chronic, at goal LDL goal of 55-70 Repeat FLP Continue lipitor 80 mg       Relevant Orders   Lipid panel   Hypothyroidism    Chronic, stable Currently on 25 mcg levothyroxine Repeat TSH and Free T4      Relevant Orders   TSH + free T4     Genitourinary   Stage 3a chronic kidney disease (HCC)    Chronic, previously stable Repeat CMP Continue Farxiga 10 mg Continue Hyzaar 50-12.5      Relevant Orders   Comprehensive Metabolic Panel (CMET)   Hemoglobin A1c   Urine Microalbumin w/creat. ratio     Other   Annual physical exam - Primary    Pt reports UTD on dental and vision Complaints of nausea;  trial of PPI to assist Things to do to keep yourself healthy  - Exercise at least 30-45 minutes a day, 3-4 days a week.  - Eat a low-fat diet with lots of fruits and vegetables, up to 7-9 servings per day.  - Seatbelts can save your life. Wear them always.  - Smoke detectors on every level of your home, check batteries every year.  - Eye Doctor - have an eye exam every 1-2 years  - Safe sex - if you may be exposed to STDs, use a condom.  - Alcohol -  If you drink, do it moderately, less than 2 drinks per day.  - [place]. Choose someone to speak for you if you are not able.  - Depression is common in our stressful world.If you're feeling down or losing interest in things you normally enjoy, please come in for a visit.  - Violence - If anyone is threatening or hurting you, please call immediately.        Relevant Orders   CBC with Differential/Platelet   Comprehensive Metabolic Panel (CMET)   TSH + free T4   Hemoglobin A1c   Urine Microalbumin w/creat. ratio   Lipid panel   Need for influenza vaccination    Consented; VIS made available; no immediate side effects following administration; plan to repeat annually       Relevant Orders   Flu Vaccine QUAD 6+ mos PF IM (Fluarix Quad PF) (Completed)   Screening for malignant neoplasm of prostate    Denies LUTS; defer DRE and complete PSA      Relevant Orders   PSA  Return in about 6 months (around 03/14/2022) for chonic disease management.    Vonna Kotyk, FNP, have reviewed all documentation for this visit. The documentation on 09/13/21 for the exam, diagnosis, procedures, and orders are all accurate and complete.  Gwyneth Sprout, [place] [phone] (phone) [phone] (fax)  [place]

## 2021-09-13 ENCOUNTER — Ambulatory Visit (INDEPENDENT_AMBULATORY_CARE_PROVIDER_SITE_OTHER): Payer: Medicare Other | Admitting: Family Medicine

## 2021-09-13 ENCOUNTER — Encounter: Payer: Self-pay | Admitting: Family Medicine

## 2021-09-13 VITALS — BP 120/80 | HR 82 | Temp 98.2°F | Resp 16 | Ht 70.0 in | Wt 208.0 lb

## 2021-09-13 DIAGNOSIS — E0822 Diabetes mellitus due to underlying condition with diabetic chronic kidney disease: Secondary | ICD-10-CM

## 2021-09-13 DIAGNOSIS — Z0000 Encounter for general adult medical examination without abnormal findings: Secondary | ICD-10-CM

## 2021-09-13 DIAGNOSIS — I152 Hypertension secondary to endocrine disorders: Secondary | ICD-10-CM | POA: Diagnosis not present

## 2021-09-13 DIAGNOSIS — E1159 Type 2 diabetes mellitus with other circulatory complications: Secondary | ICD-10-CM | POA: Diagnosis not present

## 2021-09-13 DIAGNOSIS — Z125 Encounter for screening for malignant neoplasm of prostate: Secondary | ICD-10-CM | POA: Insufficient documentation

## 2021-09-13 DIAGNOSIS — K219 Gastro-esophageal reflux disease without esophagitis: Secondary | ICD-10-CM | POA: Diagnosis not present

## 2021-09-13 DIAGNOSIS — Z23 Encounter for immunization: Secondary | ICD-10-CM | POA: Diagnosis not present

## 2021-09-13 DIAGNOSIS — E039 Hypothyroidism, unspecified: Secondary | ICD-10-CM | POA: Diagnosis not present

## 2021-09-13 DIAGNOSIS — N1831 Chronic kidney disease, stage 3a: Secondary | ICD-10-CM

## 2021-09-13 DIAGNOSIS — E1169 Type 2 diabetes mellitus with other specified complication: Secondary | ICD-10-CM | POA: Diagnosis not present

## 2021-09-13 DIAGNOSIS — E785 Hyperlipidemia, unspecified: Secondary | ICD-10-CM | POA: Diagnosis not present

## 2021-09-13 MED ORDER — OMEPRAZOLE 40 MG PO CPDR: 40.0000 mg | DELAYED_RELEASE_CAPSULE | Freq: Every day | ORAL | 11 refills | Status: DC

## 2021-09-13 NOTE — Assessment & Plan Note (Signed)
Chronic, previously stable On Farxiga 10 mg and Metformin 500 mg  Normal foot exam Repeat A1c Repeat Urine Micro

## 2021-09-13 NOTE — Assessment & Plan Note (Signed)
Pt reports UTD on dental and vision Complaints of nausea; trial of PPI to assist Things to do to keep yourself healthy  - Exercise at least 30-45 minutes a day, 3-4 days a week.  - Eat a low-fat diet with lots of fruits and vegetables, up to 7-9 servings per day.  - Seatbelts can save your life. Wear them always.  - Smoke detectors on every level of your home, check batteries every year.  - Eye Doctor - have an eye exam every 1-2 years  - Safe sex - if you may be exposed to STDs, use a condom.  - Alcohol -  If you drink, do it moderately, less than 2 drinks per day.  - Health Care Power of Attorney. Choose someone to speak for you if you are not able.  - Depression is common in our stressful world.If you're feeling down or losing interest in things you normally enjoy, please come in for a visit.  - Violence - If anyone is threatening or hurting you, please call immediately.

## 2021-09-13 NOTE — Assessment & Plan Note (Signed)
Chronic, at goal LDL goal of 55-70 Repeat FLP Continue lipitor 80 mg

## 2021-09-13 NOTE — Assessment & Plan Note (Signed)
Chronic, stable Currently on 25 mcg levothyroxine Repeat TSH and Free T4

## 2021-09-13 NOTE — Assessment & Plan Note (Signed)
Denies LUTS; defer DRE and complete PSA

## 2021-09-13 NOTE — Assessment & Plan Note (Signed)
Chronic, previously stable Repeat CMP Continue Farxiga 10 mg Continue Hyzaar 50-12.5

## 2021-09-13 NOTE — Assessment & Plan Note (Signed)
Consented; VIS made available; no immediate side effects following administration; plan to repeat annually   

## 2021-09-13 NOTE — Assessment & Plan Note (Signed)
Chronic, stable; at goal <130/<80 Denies CP Denies SOB/ DOE Denies low blood pressure/hypotension Denies vision changes No LE Edema noted on exam Continue medication, Hyzaar 50-12.5 Denies side effects Repeat CMP and CBC Seek emergent care if you develop chest pain or chest pressure

## 2021-09-13 NOTE — Assessment & Plan Note (Signed)
Reports morning nausea; hx of colon polyps Trial of PPI to assist RTC if symptoms do not improve

## 2021-09-15 LAB — CBC WITH DIFFERENTIAL/PLATELET
Basophils Absolute: 0 10*3/uL (ref 0.0–0.2)
Basos: 1 %
EOS (ABSOLUTE): 0.3 10*3/uL (ref 0.0–0.4)
Eos: 4 %
Hematocrit: 46.9 % (ref 37.5–51.0)
Hemoglobin: 15.2 g/dL (ref 13.0–17.7)
Immature Grans (Abs): 0 10*3/uL (ref 0.0–0.1)
Immature Granulocytes: 0 %
Lymphocytes Absolute: 2.9 10*3/uL (ref 0.7–3.1)
Lymphs: 45 %
MCH: 27.2 pg (ref 26.6–33.0)
MCHC: 32.4 g/dL (ref 31.5–35.7)
MCV: 84 fL (ref 79–97)
Monocytes Absolute: 0.4 10*3/uL (ref 0.1–0.9)
Monocytes: 6 %
Neutrophils Absolute: 2.8 10*3/uL (ref 1.4–7.0)
Neutrophils: 44 %
Platelets: 236 10*3/uL (ref 150–450)
RBC: 5.58 x10E6/uL (ref 4.14–5.80)
RDW: 13.7 % (ref 11.6–15.4)
WBC: 6.4 10*3/uL (ref 3.4–10.8)

## 2021-09-15 LAB — HEMOGLOBIN A1C
Est. average glucose Bld gHb Est-mCnc: 137 mg/dL
Hgb A1c MFr Bld: 6.4 % — ABNORMAL HIGH (ref 4.8–5.6)

## 2021-09-15 LAB — MICROALBUMIN / CREATININE URINE RATIO
Creatinine, Urine: 138.2 mg/dL
Microalb/Creat Ratio: 5 mg/g creat (ref 0–29)
Microalbumin, Urine: 7 ug/mL

## 2021-09-15 LAB — COMPREHENSIVE METABOLIC PANEL
ALT: 31 IU/L (ref 0–44)
AST: 24 IU/L (ref 0–40)
Albumin/Globulin Ratio: 1.3 (ref 1.2–2.2)
Albumin: 4.3 g/dL (ref 3.9–4.9)
Alkaline Phosphatase: 132 IU/L — ABNORMAL HIGH (ref 44–121)
BUN/Creatinine Ratio: 9 — ABNORMAL LOW (ref 10–24)
BUN: 13 mg/dL (ref 8–27)
Bilirubin Total: 0.5 mg/dL (ref 0.0–1.2)
CO2: 22 mmol/L (ref 20–29)
Calcium: 9.7 mg/dL (ref 8.6–10.2)
Chloride: 103 mmol/L (ref 96–106)
Creatinine, Ser: 1.37 mg/dL — ABNORMAL HIGH (ref 0.76–1.27)
Globulin, Total: 3.2 g/dL (ref 1.5–4.5)
Glucose: 92 mg/dL (ref 70–99)
Potassium: 4.5 mmol/L (ref 3.5–5.2)
Sodium: 141 mmol/L (ref 134–144)
Total Protein: 7.5 g/dL (ref 6.0–8.5)
eGFR: 59 mL/min/{1.73_m2} — ABNORMAL LOW (ref >59)

## 2021-09-15 LAB — LIPID PANEL
Chol/HDL Ratio: 3.2 ratio (ref 0.0–5.0)
Cholesterol, Total: 129 mg/dL (ref 100–199)
HDL: 40 mg/dL (ref >39)
LDL Chol Calc (NIH): 58 mg/dL (ref 0–99)
Triglycerides: 190 mg/dL — ABNORMAL HIGH (ref 0–149)
VLDL Cholesterol Cal: 31 mg/dL (ref 5–40)

## 2021-09-15 LAB — TSH+FREE T4
Free T4: 1.22 ng/dL (ref 0.82–1.77)
TSH: 2.88 u[IU]/mL (ref 0.450–4.500)

## 2021-09-15 LAB — PSA: Prostate Specific Ag, Serum: 0.8 ng/mL (ref 0.0–4.0)

## 2021-09-15 NOTE — Progress Notes (Signed)
Improved kidney function; reassuring. Continue to recommend adequate hydrate, 64 oz/water per day, and diet low in potassium/phosphorus. Urine creatinine is improved as well.  A1c remains stable in pre-diabetes range. Continue to recommend balanced, lower carb meals. Smaller meal size, adding snacks. Choosing water as drink of choice and increasing purposeful exercise.  Cholesterol stable and at goal. I recommend diet low in saturated fat and regular exercise - 30 min at least 5 times per week  All other labs are normal and stable.  Jacky Kindle, FNP  [facility] [address] [place], [place] [address] [phone] (phone) [phone] (fax) [facility]

## 2021-09-18 ENCOUNTER — Other Ambulatory Visit: Payer: Self-pay | Admitting: Family Medicine

## 2021-09-18 DIAGNOSIS — E119 Type 2 diabetes mellitus without complications: Secondary | ICD-10-CM

## 2021-09-18 DIAGNOSIS — R7303 Prediabetes: Secondary | ICD-10-CM

## 2021-10-11 ENCOUNTER — Telehealth: Payer: Self-pay | Admitting: Family Medicine

## 2021-10-11 DIAGNOSIS — E039 Hypothyroidism, unspecified: Secondary | ICD-10-CM

## 2021-10-11 MED ORDER — LEVOTHYROXINE SODIUM 25 MCG PO TABS: ORAL_TABLET | ORAL | 1 refills | Status: DC

## 2021-10-11 NOTE — Telephone Encounter (Signed)
[facility] faxed refill request for the following medications:  levothyroxine (SYNTHROID) 25 MCG tablet   Please advise.

## 2021-10-11 NOTE — Addendum Note (Signed)
Addended by: Julieta Bellini on: 10/11/2021 10:30 AM   Modules accepted: Orders

## 2021-10-27 ENCOUNTER — Other Ambulatory Visit: Payer: Self-pay | Admitting: Family Medicine

## 2021-10-27 DIAGNOSIS — I10 Essential (primary) hypertension: Secondary | ICD-10-CM

## 2021-11-22 ENCOUNTER — Other Ambulatory Visit: Payer: Self-pay | Admitting: Family Medicine

## 2021-11-22 DIAGNOSIS — I10 Essential (primary) hypertension: Secondary | ICD-10-CM

## 2021-11-22 NOTE — Telephone Encounter (Signed)
Requested Prescriptions  Pending Prescriptions Disp Refills   losartan-hydrochlorothiazide (HYZAAR) 50-12.5 MG tablet [Pharmacy Med Name: LOSARTAN-HCTZ 50-12.5 MG TAB] 90 tablet 1    Sig: TAKE 1 TABLET BY MOUTH EVERY DAY     Cardiovascular: ARB + Diuretic Combos Failed - 11/22/2021  2:42 AM      Failed - Cr in normal range and within 180 days    Creatinine, Ser  Date Value Ref Range Status  09/13/2021 1.37 (H) 0.76 - 1.27 mg/dL Final   Creatinine, POC  Date Value Ref Range Status  01/20/2017 NA mg/dL Final         Passed - K in normal range and within 180 days    Potassium  Date Value Ref Range Status  09/13/2021 4.5 3.5 - 5.2 mmol/L Final         Passed - Na in normal range and within 180 days    Sodium  Date Value Ref Range Status  09/13/2021 141 134 - 144 mmol/L Final         Passed - eGFR is 10 or above and within 180 days    GFR calc Af Amer  Date Value Ref Range Status  02/14/2019 69 >59 mL/min/1.73 Final   GFR calc non Af Amer  Date Value Ref Range Status  02/14/2019 60 >59 mL/min/1.73 Final   eGFR  Date Value Ref Range Status  09/13/2021 59 (L) >59 mL/min/1.73 Final         Passed - Patient is not pregnant      Passed - Last BP in normal range    BP Readings from Last 1 Encounters:  09/13/21 120/80         Passed - Valid encounter within last 6 months    Recent Outpatient Visits           2 months ago Annual physical exam   [facility] Tally Joe T, FNP   8 months ago 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

## 2021-12-06 ENCOUNTER — Other Ambulatory Visit: Payer: Self-pay | Admitting: Family Medicine

## 2021-12-06 DIAGNOSIS — R7303 Prediabetes: Secondary | ICD-10-CM

## 2021-12-06 DIAGNOSIS — E119 Type 2 diabetes mellitus without complications: Secondary | ICD-10-CM

## 2021-12-18 ENCOUNTER — Ambulatory Visit (INDEPENDENT_AMBULATORY_CARE_PROVIDER_SITE_OTHER): Payer: Medicare Other

## 2021-12-18 VITALS — BP 130/78 | Ht 70.0 in | Wt 213.3 lb

## 2021-12-18 DIAGNOSIS — Z0000 Encounter for general adult medical examination without abnormal findings: Secondary | ICD-10-CM

## 2021-12-18 NOTE — Patient Instructions (Signed)
[name] , Thank you for taking time to come for your Medicare Wellness Visit. I appreciate your ongoing commitment to your health goals. Please review the following plan we discussed and let me know if I can assist you in the future.   Screening recommendations/referrals: Colonoscopy: 06/24/21, every 10 years Recommended yearly ophthalmology/optometry visit for glaucoma screening and checkup Recommended yearly dental visit for hygiene and checkup  Vaccinations: Influenza vaccine: 09/13/21 Pneumococcal vaccine: 11/14/20 Tdap vaccine: 03/26/18 Shingles vaccine: Shingrix  11/04/21, need 2nd shot   Covid-19: 03/31/19, 04/27/19, 12/27/19, 11/04/21  Advanced directives: no  Conditions/risks identified: none  Next appointment: Follow up in one year for your annual wellness visit 12/23/22 @ 9:30 in person  Preventive Care 40-64 Years, Male Preventive care refers to lifestyle choices and visits with your health care provider that can promote health and wellness. What does preventive care include? A yearly physical exam. This is also called an annual well check. Dental exams once or twice a year. Routine eye exams. Ask your health care provider how often you should have your eyes checked. Personal lifestyle choices, including: Daily care of your teeth and gums. Regular physical activity. Eating a healthy diet. Avoiding tobacco and drug use. Limiting alcohol use. Practicing safe sex. Taking low-dose aspirin every day starting at age 30. What happens during an annual well check? The services and screenings done by your health care provider during your annual well check will depend on your age, overall health, lifestyle risk factors, and family history of disease. Counseling  Your health care provider may ask you questions about your: Alcohol use. Tobacco use. Drug use. Emotional well-being. Home and relationship well-being. Sexual activity. Eating habits. Work and work  Astronomer. Screening  You may have the following tests or measurements: Height, weight, and BMI. Blood pressure. Lipid and cholesterol levels. These may be checked every 5 years, or more frequently if you are over 22 years old. Skin check. Lung cancer screening. You may have this screening every year starting at age 32 if you have a 30-pack-year history of smoking and currently smoke or have quit within the past 15 years. Fecal occult blood test (FOBT) of the stool. You may have this test every year starting at age 43. Flexible sigmoidoscopy or colonoscopy. You may have a sigmoidoscopy every 5 years or a colonoscopy every 10 years starting at age 90. Prostate cancer screening. Recommendations will vary depending on your family history and other risks. Hepatitis C blood test. Hepatitis B blood test. Sexually transmitted disease (STD) testing. Diabetes screening. This is done by checking your blood sugar (glucose) after you have not eaten for a while (fasting). You may have this done every 1-3 years. Discuss your test results, treatment options, and if necessary, the need for more tests with your health care provider. Vaccines  Your health care provider may recommend certain vaccines, such as: Influenza vaccine. This is recommended every year. Tetanus, diphtheria, and acellular pertussis (Tdap, Td) vaccine. You may need a Td booster every 10 years. Zoster vaccine. You may need this after age 3. Pneumococcal 13-valent conjugate (PCV13) vaccine. You may need this if you have certain conditions and have not been vaccinated. Pneumococcal polysaccharide (PPSV23) vaccine. You may need one or two doses if you smoke cigarettes or if you have certain conditions. Talk to your health care provider about which screenings and vaccines you need and how often you need them. This information is not intended to replace advice given to you by your health care  provider. Make sure you discuss any questions you  have with your health care provider. Document Released: 01/19/2015 Document Revised: 09/12/2015 Document Reviewed: 10/24/2014 Elsevier Interactive Patient Education  2017 [place] Prevention in the Home Falls can cause injuries. They can happen to people of all ages. There are many things you can do to make your home safe and to help prevent falls. What can I do on the outside of my home? Regularly fix the edges of walkways and driveways and fix any cracks. Remove anything that might make you trip as you walk through a door, such as a raised step or threshold. Trim any bushes or trees on the path to your home. Use bright outdoor lighting. Clear any walking paths of anything that might make someone trip, such as rocks or tools. Regularly check to see if handrails are loose or broken. Make sure that both sides of any steps have handrails. Any raised decks and porches should have guardrails on the edges. Have any leaves, snow, or ice cleared regularly. Use sand or salt on walking paths during winter. Clean up any spills in your garage right away. This includes oil or grease spills. What can I do in the bathroom? Use night lights. Install grab bars by the toilet and in the tub and shower. Do not use towel bars as grab bars. Use non-skid mats or decals in the tub or shower. If you need to sit down in the shower, use a plastic, non-slip stool. Keep the floor dry. Clean up any water that spills on the floor as soon as it happens. Remove soap buildup in the tub or shower regularly. Attach bath mats securely with double-sided non-slip rug tape. Do not have throw rugs and other things on the floor that can make you trip. What can I do in the bedroom? Use night lights. Make sure that you have a light by your bed that is easy to reach. Do not use any sheets or blankets that are too big for your bed. They should not hang down onto the floor. Have a firm chair that has side arms. You can  use this for support while you get dressed. Do not have throw rugs and other things on the floor that can make you trip. What can I do in the kitchen? Clean up any spills right away. Avoid walking on wet floors. Keep items that you use a lot in easy-to-reach places. If you need to reach something above you, use a strong step stool that has a grab bar. Keep electrical cords out of the way. Do not use floor polish or wax that makes floors slippery. If you must use wax, use non-skid floor wax. Do not have throw rugs and other things on the floor that can make you trip. What can I do with my stairs? Do not leave any items on the stairs. Make sure that there are handrails on both sides of the stairs and use them. Fix handrails that are broken or loose. Make sure that handrails are as long as the stairways. Check any carpeting to make sure that it is firmly attached to the stairs. Fix any carpet that is loose or worn. Avoid having throw rugs at the top or bottom of the stairs. If you do have throw rugs, attach them to the floor with carpet tape. Make sure that you have a light switch at the top of the stairs and the bottom of the stairs. If you do not have  them, ask someone to add them for you. What else can I do to help prevent falls? Wear shoes that: Do not have high heels. Have rubber bottoms. Are comfortable and fit you well. Are closed at the toe. Do not wear sandals. If you use a stepladder: Make sure that it is fully opened. Do not climb a closed stepladder. Make sure that both sides of the stepladder are locked into place. Ask someone to hold it for you, if possible. Clearly mark and make sure that you can see: Any grab bars or handrails. First and last steps. Where the edge of each step is. Use tools that help you move around (mobility aids) if they are needed. These include: Canes. Walkers. Scooters. Crutches. Turn on the lights when you go into a dark area. Replace any light  bulbs as soon as they burn out. Set up your furniture so you have a clear path. Avoid moving your furniture around. If any of your floors are uneven, fix them. If there are any pets around you, be aware of where they are. Review your medicines with your doctor. Some medicines can make you feel dizzy. This can increase your chance of falling. Ask your doctor what other things that you can do to help prevent falls. This information is not intended to replace advice given to you by your health care provider. Make sure you discuss any questions you have with your health care provider. Document Released: 10/19/2008 Document Revised: 05/31/2015 Document Reviewed: 01/27/2014 Elsevier Interactive Patient Education  2017 [employer].

## 2021-12-18 NOTE — Progress Notes (Signed)
Subjective:   Francis Gomez is a 61 y.o. male who presents for an Initial Medicare Annual Wellness Visit.  Review of Systems     Cardiac Risk Factors include: advanced age (>78mn, >>47women);hypertension;male gender     Objective:    Today's Vitals   12/18/21 0931  BP: 130/78  Weight: 213 lb 4.8 oz (96.8 kg)  Height: _0  (1.778 m)   Body mass index is 30.61 kg/m.     12/18/2021    9:39 AM 06/24/2021    7:24 AM 05/25/2020    7:19 AM 03/10/2019   11:13 AM  Advanced Directives  Does Patient Have a Medical Advance Directive? No No No No  Would patient like information on creating a medical advance directive? No - Patient declined   No - Patient declined    Current Medications (verified) Outpatient Encounter Medications as of 12/18/2021  Medication Sig   atorvastatin (LIPITOR) 80 MG tablet Take 1 tablet (80 mg total) by mouth daily.   dapagliflozin propanediol (FARXIGA) 10 MG TABS tablet Take 1 tablet (10 mg total) by mouth daily before breakfast.   levothyroxine (SYNTHROID) 25 MCG tablet TAKE 1 TABLET (25 MCG TOTAL) BY MOUTH DAILY-- BEFORE BREAKFAST.   losartan-hydrochlorothiazide (HYZAAR) 50-12.5 MG tablet TAKE 1 TABLET BY MOUTH EVERY DAY   metFORMIN (GLUCOPHAGE) 500 MG tablet TAKE 1 TABLET BY MOUTH EVERY DAY WITH BREAKFAST   omeprazole (PRILOSEC) 40 MG capsule Take 1 capsule (40 mg total) by mouth at bedtime.   verapamil (CALAN) 120 MG tablet TAKE 2 TABLETS (240 MG TOTAL) BY MOUTH DAILY.   No facility-administered encounter medications on file as of 12/18/2021.    Allergies (verified) Amlodipine   History: Past Medical History:  Diagnosis Date   Cervical paraspinous muscle spasm    [place]    Hypertension    Hypothyroidism    Obesity    Osteoarthritis    left knee   Past Surgical History:  Procedure Laterality Date   COLONOSCOPY WITH PROPOFOL N/A 05/25/2020   Procedure: COLONOSCOPY WITH PROPOFOL;  Surgeon:  TVirgel Manifold MD;  Location: [facility] ENDOSCOPY;  Service: Endoscopy;  Laterality: N/A;   COLONOSCOPY WITH PROPOFOL N/A 06/24/2021   Procedure: COLONOSCOPY WITH PROPOFOL;  Surgeon: VLin Landsman MD;  [facility];  Service: Gastroenterology;  Laterality: N/A;   KNEE SURGERY Left    x's 2   Family History  Problem Relation Age of Onset   Diabetes Mother    Heart disease Father [phone]      MI   Heart disease Sister    Social History   Socioeconomic History   Marital status: Single    Spouse name: Not on file   Number of children: Not on file   Years of education: Not on file   Highest education level: Not on file  Occupational History   Not on file  Tobacco Use   Smoking status: Never   Smokeless tobacco: Never  Vaping Use   Vaping Use: Never used  Substance and Sexual Activity   Alcohol use: No   Drug use: No   Sexual activity: Not on file  Other Topics Concern   Not on file  Social History Narrative   Not on file   Social Determinants of Health   Financial Resource Strain: Low Risk  (12/18/2021)   Overall Financial Resource Strain (CARDIA)    Difficulty of Paying Living Expenses: Not hard at all  Food Insecurity: No Food Insecurity (  12/18/2021)   Hunger Vital Sign    Worried About Running Out of Food in the Last Year: Never true    [place] in the Last Year: Never true  Transportation Needs: No Transportation Needs (12/18/2021)   PRAPARE - Hydrologist (Medical): No    Lack of Transportation (Non-Medical): No  Physical Activity: Sufficiently Active (12/18/2021)   Exercise Vital Sign    Days of Exercise per Week: 7 days    Minutes of Exercise per Session: 30 min  Stress: No Stress Concern Present (12/18/2021)   [place]    Feeling of Stress : Not at all  Social Connections: Socially Isolated (12/18/2021)   Social Connection and Isolation  Panel [NHANES]    Frequency of Communication with Friends and Family: More than three times a week    Frequency of Social Gatherings with Friends and Family: Once a week    Attends Religious Services: Never    Marine scientist or Organizations: No    Attends Music therapist: Never    Marital Status: Divorced    Tobacco Counseling Counseling given: Not Answered   Clinical Intake:  Pre-visit preparation completed: Yes  Pain : No/denies pain     Nutritional Risks: None Diabetes: Yes CBG done?: No Did pt. bring in CBG monitor from home?: No  How often do you need to have someone help you when you read instructions, pamphlets, or other written materials from your doctor or pharmacy?: 1 - Never  Diabetic?no  Interpreter Needed?: No  Information entered by :: Kirke Shaggy, LPN   Activities of Daily Living    12/18/2021    9:39 AM 09/13/2021    8:42 AM  In your present state of health, do you have any difficulty performing the following activities:  Hearing? 0 0  Vision? 0 0  Difficulty concentrating or making decisions? 0 0  Walking or climbing stairs? 0 0  Dressing or bathing? 0 0  Doing errands, shopping? 0 0  Preparing Food and eating ? N   Using the Toilet? N   In the past six months, have you accidently leaked urine? N   Do you have problems with loss of bowel control? N   Managing your Medications? N   Managing your Finances? N   Housekeeping or managing your Housekeeping? N     Patient Care Team: Gwyneth Sprout, FNP as PCP - General (Family Medicine)  Indicate any recent Medical Services you may have received from other than [facility] providers in the past year (date may be approximate).     Assessment:   This is a routine wellness examination for St[place]ings.  Hearing/Vision screen Hearing Screening - Comments:: No aids Vision Screening - Comments:: No glasses  Dietary issues and exercise activities discussed: Current Exercise Habits:  Home exercise routine, Type of exercise: walking, Time (Minutes): 30, Frequency (Times/Week): 7, Weekly Exercise (Minutes/Week): 210, Intensity: Mild   Goals Addressed             This Visit's Progress    DIET - EAT MORE FRUITS AND VEGETABLES         Depression Screen    12/18/2021    9:37 AM 09/13/2021    8:42 AM 11/14/2020    8:35 AM 07/13/2020    8:25 AM 03/10/2019   11:14 AM 03/26/2018   10:13 AM 01/20/2017    8:51 AM  PHQ 2/9 Scores  PHQ - 2 Score 0 0 0 _0 PHQ- 9 Score 0 _1 Fall Risk    12/18/2021    9:39 AM 09/13/2021    8:42 AM 11/14/2020    8:35 AM 03/10/2019   11:14 AM 01/20/2017    8:51 AM  Fall Risk   Falls in the past year? 0 0 0 0 No  Number falls in past yr: 0 0 0    Injury with Fall? 0 0 0    Risk for fall due to : No Fall Risks No Fall Risks No Fall Risks Impaired balance/gait   Follow up Falls prevention discussed;Falls evaluation completed Falls evaluation completed Falls evaluation completed Falls prevention discussed     FALL RISK PREVENTION PERTAINING TO THE HOME:  Any stairs in or around the home? No  If so, are there any without handrails? No  Home free of loose throw rugs in walkways, pet beds, electrical cords, etc? Yes  Adequate lighting in your home to reduce risk of falls? Yes   ASSISTIVE DEVICES UTILIZED TO PREVENT FALLS:  Life alert? No  Use of a cane, walker or w/c? No  Grab bars in the bathroom? No  Shower chair or bench in shower? No  Elevated toilet seat or a handicapped toilet? No   TIMED UP AND GO:  Was the test performed? Yes .  Length of time to ambulate 10 feet: 4 sec.   Gait steady and fast without use of assistive device  Cognitive Function:        12/18/2021    9:40 AM  6CIT Screen  What Year? 0 points  What month? 0 points  What time? 0 points  Count back from 20 0 points  Months in reverse 0 points  Repeat phrase 0 points  Total Score 0 points    Immunizations Immunization History   Administered Date(s) Administered   COVID-19, mRNA, vaccine(Comirnaty)12 years and older 11/04/2021   Influenza,inj,Quad PF,6+ Mos 03/26/2018, 02/11/2019, 09/01/2019, 03/13/2021, 09/13/2021   Influenza-Unspecified 10/06/2016   MODERNA COVID-19 SARS-COV-2 PEDS BIVALENT BOOSTER 6Y-11Y 03/31/2019, 04/27/2019, 12/27/2019   PNEUMOCOCCAL CONJUGATE-20 11/14/2020   Tdap 12/29/2006, 03/26/2018   Zoster Recombinat (Shingrix) 11/04/2021    TDAP status: Up to date  Flu Vaccine status: Up to date  Pneumococcal vaccine status: Up to date  Covid-19 vaccine status: Completed vaccines  Qualifies for Shingles Vaccine? Yes   Zostavax completed No   Shingrix Completed?: No.    Education has been provided regarding the importance of this vaccine. Patient has been advised to call insurance company to determine out of pocket expense if they have not yet received this vaccine. Advised may also receive vaccine at local pharmacy or Health Dept. Verbalized acceptance and understanding.  Screening Tests Health Maintenance  Topic Date Due   Zoster Vaccines- Shingrix (2 of 2) 12/30/2021   OPHTHALMOLOGY EXAM  02/21/2022   HEMOGLOBIN A1C  03/14/2022   COLONOSCOPY (Pts 45-23yr Insurance coverage will need to be confirmed)  06/25/2022   Diabetic kidney evaluation - eGFR measurement  09/14/2022   Diabetic kidney evaluation - Urine ACR  09/14/2022   FOOT EXAM  09/14/2022   Medicare Annual Wellness (AWV)  12/19/2022   DTaP/Tdap/Td (3 - Td or Tdap) 03/25/2028   INFLUENZA VACCINE  Completed   COVID-19 Vaccine  Completed   Hepatitis C Screening  Completed   HIV Screening  Completed   HPV VACCINES  Aged Out  Health Maintenance  There are no preventive care reminders to display for this patient.   Colorectal cancer screening: Type of screening: Colonoscopy. Completed 06/24/21. Repeat every 10 years  Lung Cancer Screening: (Low Dose CT Chest recommended if Age 42-80 years, 30 pack-year currently smoking OR  have quit w/in 15years.) does not qualify.    Additional Screening:  Hepatitis C Screening: does qualify; Completed 01/20/17  Vision Screening: Recommended annual ophthalmology exams for early detection of glaucoma and other disorders of the eye. Is the patient up to date with their annual eye exam?  No  Who is the provider or what is the name of the office in which the patient attends annual eye exams? No one If pt is not established with a provider, would they like to be referred to a provider to establish care? No .   Dental Screening: Recommended annual dental exams for proper oral hygiene  Community Resource Referral / Chronic Care Management: CRR required this visit?  No   CCM required this visit?  No      Plan:     I have personally reviewed and noted the following in the patient's chart:   Medical and social history Use of alcohol, tobacco or illicit drugs  Current medications and supplements including opioid prescriptions. Patient is not currently taking opioid prescriptions. Functional ability and status Nutritional status Physical activity Advanced directives List of other physicians Hospitalizations, surgeries, and ER visits in previous 12 months Vitals Screenings to include cognitive, depression, and falls Referrals and appointments  In addition, I have reviewed and discussed with patient certain preventive protocols, quality metrics, and best practice recommendations. A written personalized care plan for preventive services as well as general preventive health recommendations were provided to patient.     Dionisio David, LPN   [id number]   Nurse Notes: none

## 2022-02-25 ENCOUNTER — Other Ambulatory Visit: Payer: Self-pay | Admitting: Family Medicine

## 2022-02-25 DIAGNOSIS — N1831 Chronic kidney disease, stage 3a: Secondary | ICD-10-CM

## 2022-03-03 ENCOUNTER — Other Ambulatory Visit: Payer: Self-pay | Admitting: Family Medicine

## 2022-03-03 DIAGNOSIS — R7303 Prediabetes: Secondary | ICD-10-CM

## 2022-03-03 DIAGNOSIS — E119 Type 2 diabetes mellitus without complications: Secondary | ICD-10-CM

## 2022-03-04 DIAGNOSIS — H524 Presbyopia: Secondary | ICD-10-CM | POA: Diagnosis not present

## 2022-03-04 DIAGNOSIS — E119 Type 2 diabetes mellitus without complications: Secondary | ICD-10-CM | POA: Diagnosis not present

## 2022-03-04 LAB — HM DIABETES EYE EXAM

## 2022-03-06 ENCOUNTER — Other Ambulatory Visit: Payer: Self-pay | Admitting: Family Medicine

## 2022-03-11 NOTE — Progress Notes (Signed)
Established patient visit  Patient: Francis Gomez   DOB: 09-01-1960   62 y.o. Male  MRN: [id number] Visit Date: 03/12/2022  Today's healthcare provider: Jacky Kindle, FNP  Re Introduced to nurse practitioner role and practice setting.  All questions answered.  Discussed provider/patient relationship and expectations.  Subjective    HPI HPI   POC A1c/2nd dose of shingles vaccine/6 month f/u Last edited by Shelly Bombard, CMA on 03/12/2022  8:27 AM.      Diabetes Mellitus Type II, follow-up  Lab Results  Component Value Date   HGBA1C 6.6 (A) 03/12/2022   HGBA1C 6.4 (H) 09/13/2021   HGBA1C 6.3 (A) 03/13/2021   Last seen for diabetes 3 months ago.  Management since then includes continuing the same treatment.On Farxiga 10 mg and Metformin 500 m  He reports excellent compliance with treatment. He is not having side effects.   Home blood sugar records:  not being checked  Episodes of hypoglycemia? No    Current insulin regiment: none Most Recent Eye Exam: yes; will send for results from [facility]  --------------------------------------------------------------------------------------------------- Hypertension, follow-up  BP Readings from Last 3 Encounters:  03/12/22 128/82  12/18/21 130/78  09/13/21 120/80   Wt Readings from Last 3 Encounters:  03/12/22 219 lb (99.3 kg)  12/18/21 213 lb 4.8 oz (96.8 kg)  09/13/21 208 lb (94.3 kg)     He was last seen for hypertension 3 months ago.  BP at that visit was 120/80. Management since that visit includes Continue medication, Hyzaar 50-12.5 . He reports excellent compliance with treatment.  Outside blood pressures are 110-120/70-80s.  He does not smoke.  --------------------------------------------------------------------------------------------------- Lipid/Cholesterol, follow-up  Last Lipid Panel: Lab Results  Component Value Date   CHOL 129 09/13/2021   LDLCALC 58 09/13/2021   HDL 40 09/13/2021   TRIG 190  (H) 09/13/2021    He was last seen for this 6 months ago.  Management since that visit includes continue statin.  He reports excellent compliance with treatment.  Last metabolic panel Lab Results  Component Value Date   GLUCOSE 92 09/13/2021   NA 141 09/13/2021   K 4.5 09/13/2021   BUN 13 09/13/2021   CREATININE 1.37 (H) 09/13/2021   EGFR 59 (L) 09/13/2021   GFRNONAA 60 02/14/2019   CALCIUM 9.7 09/13/2021   AST 24 09/13/2021   ALT 31 09/13/2021   The ASCVD Risk score (Arnett DK, et al., 2019) failed to calculate for the following reasons:   The valid total cholesterol range is 130 to 320 mg/dL  ---------------------------------------------------------------------------------------------------   Medications: Outpatient Medications Prior to Visit  Medication Sig   atorvastatin (LIPITOR) 80 MG tablet TAKE 1 TABLET BY MOUTH EVERY DAY   FARXIGA 10 MG TABS tablet TAKE 1 TABLET BY MOUTH DAILY BEFORE BREAKFAST.   levothyroxine (SYNTHROID) 25 MCG tablet TAKE 1 TABLET (25 MCG TOTAL) BY MOUTH DAILY-- BEFORE BREAKFAST.   losartan-hydrochlorothiazide (HYZAAR) 50-12.5 MG tablet TAKE 1 TABLET BY MOUTH EVERY DAY   metFORMIN (GLUCOPHAGE) 500 MG tablet TAKE 1 TABLET BY MOUTH EVERY DAY WITH BREAKFAST   omeprazole (PRILOSEC) 40 MG capsule Take 1 capsule (40 mg total) by mouth at bedtime.   verapamil (CALAN) 120 MG tablet TAKE 2 TABLETS (240 MG TOTAL) BY MOUTH DAILY.   No facility-administered medications prior to visit.    Review of Systems  Constitutional:  Negative for appetite change, fatigue and unexpected weight change.  Eyes:  Negative for visual disturbance.  Respiratory:  Negative for chest tightness  and shortness of breath.   Cardiovascular:  Negative for chest pain, palpitations and leg swelling.  Gastrointestinal:  Negative for abdominal pain, nausea and vomiting.  Endocrine: Negative for polydipsia, polyphagia and polyuria.  Neurological:  Negative for dizziness,  light-headedness and headaches.    Last CBC Lab Results  Component Value Date   WBC 6.4 09/13/2021   HGB 15.2 09/13/2021   HCT 46.9 09/13/2021   MCV 84 09/13/2021   MCH 27.2 09/13/2021   RDW 13.7 09/13/2021   PLT 236 09/13/2021   Last metabolic panel Lab Results  Component Value Date   GLUCOSE 92 09/13/2021   NA 141 09/13/2021   K 4.5 09/13/2021   CL 103 09/13/2021   CO2 22 09/13/2021   BUN 13 09/13/2021   CREATININE 1.37 (H) 09/13/2021   EGFR 59 (L) 09/13/2021   CALCIUM 9.7 09/13/2021   PROT 7.5 09/13/2021   ALBUMIN 4.3 09/13/2021   LABGLOB 3.2 09/13/2021   AGRATIO 1.3 09/13/2021   BILITOT 0.5 09/13/2021   ALKPHOS 132 (H) 09/13/2021   AST 24 09/13/2021   ALT 31 09/13/2021   Last lipids Lab Results  Component Value Date   CHOL 129 09/13/2021   HDL 40 09/13/2021   LDLCALC 58 09/13/2021   TRIG 190 (H) 09/13/2021   CHOLHDL 3.2 09/13/2021   Last hemoglobin A1c Lab Results  Component Value Date   HGBA1C 6.6 (A) 03/12/2022       Objective    BP 128/82 Comment: home reading  Pulse 95   Ht 5\' 10"  (1.778 m)   Wt 219 lb (99.3 kg)   SpO2 98%   BMI 31.42 kg/m  BP Readings from Last 3 Encounters:  03/12/22 128/82  12/18/21 130/78  09/13/21 120/80   Wt Readings from Last 3 Encounters:  03/12/22 219 lb (99.3 kg)  12/18/21 213 lb 4.8 oz (96.8 kg)  09/13/21 208 lb (94.3 kg)   SpO2 Readings from Last 3 Encounters:  03/12/22 98%  09/13/21 98%  06/24/21 96%      Physical Exam Vitals and nursing note reviewed.  Constitutional:      Appearance: Normal appearance. He is obese.  HENT:     Head: Normocephalic and atraumatic.  Eyes:     Pupils: Pupils are equal, round, and reactive to light.  Cardiovascular:     Rate and Rhythm: Normal rate and regular rhythm.     Pulses: Normal pulses.     Heart sounds: Normal heart sounds.  Pulmonary:     Effort: Pulmonary effort is normal.     Breath sounds: Normal breath sounds.  Musculoskeletal:        General:  Normal range of motion.     Cervical back: Normal range of motion.  Skin:    General: Skin is warm and dry.     Capillary Refill: Capillary refill takes less than 2 seconds.  Neurological:     General: No focal deficit present.     Mental Status: He is alert and oriented to person, place, and time. Mental status is at baseline.  Psychiatric:        Attention and Perception: Attention normal.        Mood and Affect: Affect is flat.        Speech: Speech normal.        Behavior: Behavior normal.        Thought Content: Thought content normal.        Cognition and Memory: Cognition and memory normal.  Judgment: Judgment normal.     Results for orders placed or performed in visit on 03/12/22  POCT HgB A1C  Result Value Ref Range   Hemoglobin A1C 6.6 (A) 4.0 - 5.6 %   HbA1c POC (<> result, manual entry)     HbA1c, POC (prediabetic range)     HbA1c, POC (controlled diabetic range)      Assessment & Plan     Problem List Items Addressed This Visit       Cardiovascular and Mediastinum   Hypertension associated with diabetes (HCC)    Chronic, elevated in office- stable at home per pt report Continue to monitor at home; emphasis of meeting goal of <130/<80 to assist Defer medication changes today; plan to return to clinic in 3 months Continue farxiga 10, hyzaar 50-12.5, verapamili 240 mg       Relevant Orders   Comprehensive metabolic panel     Endocrine   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 Smaller meal size, adding snacks. Choosing water as drink of choice and increasing purposeful exercise. Remains on 500 mg daily in addition to [place] 10 mg       Relevant Orders   Comprehensive metabolic panel   POCT HgB A1C (Completed)   Hyperlipidemia associated with type 2 diabetes mellitus  (HCC)    Chronic, previously stable       Relevant Orders   Lipid panel     Genitourinary   Stage 3a chronic kidney disease (HCC)    Chronic, eGFR previously of 59. Repeat CMP today Continue to recommend reduction in NSAIDs and ETOH if applicable. Emphasis provided on water intake 48-64 oz/day      Relevant Orders   Comprehensive metabolic panel   Other Visit Diagnoses     Need for shingles vaccine       Relevant Orders   Varicella-zoster vaccine IM (Completed)      Return in about 3 months (around 06/12/2022) for chonic disease management, T2DM management, HTN management.     Leilani Merl, FNP, have reviewed all documentation for this visit. The documentation on 03/12/22 for the exam, diagnosis, procedures, and orders are all accurate and complete.  Jacky Kindle, FNP  [facility] [phone] (phone) [phone] (fax)  [facility]

## 2022-03-12 ENCOUNTER — Ambulatory Visit (INDEPENDENT_AMBULATORY_CARE_PROVIDER_SITE_OTHER): Payer: 59 | Admitting: Family Medicine

## 2022-03-12 ENCOUNTER — Encounter: Payer: Self-pay | Admitting: Family Medicine

## 2022-03-12 VITALS — BP 128/82 | HR 95 | Ht 70.0 in | Wt 219.0 lb

## 2022-03-12 DIAGNOSIS — N1831 Chronic kidney disease, stage 3a: Secondary | ICD-10-CM

## 2022-03-12 DIAGNOSIS — I152 Hypertension secondary to endocrine disorders: Secondary | ICD-10-CM

## 2022-03-12 DIAGNOSIS — E1169 Type 2 diabetes mellitus with other specified complication: Secondary | ICD-10-CM | POA: Diagnosis not present

## 2022-03-12 DIAGNOSIS — E785 Hyperlipidemia, unspecified: Secondary | ICD-10-CM | POA: Diagnosis not present

## 2022-03-12 DIAGNOSIS — E1159 Type 2 diabetes mellitus with other circulatory complications: Secondary | ICD-10-CM

## 2022-03-12 DIAGNOSIS — Z23 Encounter for immunization: Secondary | ICD-10-CM

## 2022-03-12 DIAGNOSIS — E0822 Diabetes mellitus due to underlying condition with diabetic chronic kidney disease: Secondary | ICD-10-CM | POA: Diagnosis not present

## 2022-03-12 LAB — POCT GLYCOSYLATED HEMOGLOBIN (HGB A1C): Hemoglobin A1C: 6.6 % — AB (ref 4.0–5.6)

## 2022-03-12 NOTE — Assessment & Plan Note (Signed)
Chronic, slightly increased Continue to recommend balanced, lower carb meals. Smaller meal size, adding snacks. Choosing water as drink of choice and increasing purposeful exercise. Remains on 500 mg daily in addition to Farxiga 10 mg

## 2022-03-12 NOTE — Assessment & Plan Note (Signed)
Chronic, eGFR previously of 59. Repeat CMP today Continue to recommend reduction in NSAIDs and ETOH if applicable. Emphasis provided on water intake 48-64 oz/day

## 2022-03-12 NOTE — Assessment & Plan Note (Signed)
Chronic, elevated in office- stable at home per pt report Continue to monitor at home; emphasis of meeting goal of <130/<80 to assist Defer medication changes today; plan to return to clinic in 3 months Continue farxiga 10, hyzaar 50-12.5, verapamili 240 mg

## 2022-03-12 NOTE — Assessment & Plan Note (Signed)
Chronic, previously stable

## 2022-03-13 LAB — COMPREHENSIVE METABOLIC PANEL
ALT: 42 IU/L (ref 0–44)
AST: 30 IU/L (ref 0–40)
Albumin/Globulin Ratio: 1.3 (ref 1.2–2.2)
Albumin: 4.2 g/dL (ref 3.9–4.9)
Alkaline Phosphatase: 145 IU/L — ABNORMAL HIGH (ref 44–121)
BUN/Creatinine Ratio: 11 (ref 10–24)
BUN: 15 mg/dL (ref 8–27)
Bilirubin Total: 0.4 mg/dL (ref 0.0–1.2)
CO2: 23 mmol/L (ref 20–29)
Calcium: 9.6 mg/dL (ref 8.6–10.2)
Chloride: 103 mmol/L (ref 96–106)
Creatinine, Ser: 1.34 mg/dL — ABNORMAL HIGH (ref 0.76–1.27)
Globulin, Total: 3.3 g/dL (ref 1.5–4.5)
Glucose: 94 mg/dL (ref 70–99)
Potassium: 3.8 mmol/L (ref 3.5–5.2)
Sodium: 142 mmol/L (ref 134–144)
Total Protein: 7.5 g/dL (ref 6.0–8.5)
eGFR: 60 mL/min/{1.73_m2} (ref >59)

## 2022-03-13 LAB — LIPID PANEL
Chol/HDL Ratio: 3.8 ratio (ref 0.0–5.0)
Cholesterol, Total: 145 mg/dL (ref 100–199)
HDL: 38 mg/dL — ABNORMAL LOW (ref >39)
LDL Chol Calc (NIH): 66 mg/dL (ref 0–99)
Triglycerides: 254 mg/dL — ABNORMAL HIGH (ref 0–149)
VLDL Cholesterol Cal: 41 mg/dL — ABNORMAL HIGH (ref 5–40)

## 2022-03-13 NOTE — Progress Notes (Signed)
Slight increase in alkaline phosphatase and cholesterol.  Stroke and/or MI risk remains elevated at 25%. I continue to recommend diet low in saturated fat and regular exercise - 30 min at least 5 times per week. Recommend increase in diet of healthier fat choices- low fat meats, oils that are not solid at room temperature, nuts, seeds, fish- cod, halibut, salmon, and avocado. Exercise can also increase this number.  Supplemental omega 3's can be taken as well but are not as helpful as dietary/exercise changes.   The 10-year ASCVD risk score (Arnett DK, et al., 2019) is: 24.6%   Values used to calculate the score:     Age: 62 years     Sex: Male     Is Non-Hispanic African American: Yes     Diabetic: Yes     Tobacco smoker: No     Systolic Blood Pressure: 0000000 mmHg     Is BP treated: Yes     HDL Cholesterol: 38 mg/dL     Total Cholesterol: 145 mg/dL

## 2022-04-30 ENCOUNTER — Other Ambulatory Visit: Payer: Self-pay | Admitting: Family Medicine

## 2022-04-30 DIAGNOSIS — I10 Essential (primary) hypertension: Secondary | ICD-10-CM

## 2022-04-30 NOTE — Telephone Encounter (Signed)
Requested Prescriptions  Pending Prescriptions Disp Refills   verapamil (CALAN) 120 MG tablet [Pharmacy Med Name: VERAPAMIL 120 MG TABLET] 180 tablet 0    Sig: TAKE 2 TABLETS (240 MG TOTAL) BY MOUTH DAILY.     Cardiovascular: Calcium Channel Blockers 3 Failed - 04/30/2022  1:27 AM      Failed - Cr in normal range and within 360 days    Creatinine, Ser  Date Value Ref Range Status  03/12/2022 1.34 (H) 0.76 - 1.27 mg/dL Final   Creatinine, POC  Date Value Ref Range Status  01/20/2017 NA mg/dL Final         Passed - ALT in normal range and within 360 days    ALT  Date Value Ref Range Status  03/12/2022 42 0 - 44 IU/L Final         Passed - AST in normal range and within 360 days    AST  Date Value Ref Range Status  03/12/2022 30 0 - 40 IU/L Final         Passed - Last BP in normal range    BP Readings from Last 1 Encounters:  03/12/22 128/82         Passed - Last Heart Rate in normal range    Pulse Readings from Last 1 Encounters:  03/12/22 95         Passed - Valid encounter within last 6 months    Recent Outpatient Visits           1 month ago 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)   [place] [facility] Merita Norton T, FNP   1 year ago Diabetes mellitus without complication [facility])   [place] [facility] Bosie Clos, MD   1 year ago Essential hypertension   [place] [facility] Chrismon, Jodell Cipro, PA-C       Future Appointments             In 1 month Suzie Portela, Daryl Eastern, FNP [facility], [facility]

## 2022-05-04 ENCOUNTER — Other Ambulatory Visit: Payer: Self-pay | Admitting: Family Medicine

## 2022-05-04 DIAGNOSIS — E039 Hypothyroidism, unspecified: Secondary | ICD-10-CM

## 2022-05-16 ENCOUNTER — Other Ambulatory Visit: Payer: Self-pay | Admitting: Family Medicine

## 2022-05-16 DIAGNOSIS — I10 Essential (primary) hypertension: Secondary | ICD-10-CM

## 2022-05-16 NOTE — Telephone Encounter (Signed)
Requested Prescriptions  Pending Prescriptions Disp Refills   losartan-hydrochlorothiazide (HYZAAR) 50-12.5 MG tablet [Pharmacy Med Name: LOSARTAN-HCTZ 50-12.5 MG TAB] 90 tablet 1    Sig: TAKE 1 TABLET BY MOUTH EVERY DAY     Cardiovascular: ARB + Diuretic Combos Failed - 05/16/2022  2:18 AM      Failed - Cr in normal range and within 180 days    Creatinine, Ser  Date Value Ref Range Status  03/12/2022 1.34 (H) 0.76 - 1.27 mg/dL Final   Creatinine, POC  Date Value Ref Range Status  01/20/2017 NA mg/dL Final         Passed - K in normal range and within 180 days    Potassium  Date Value Ref Range Status  03/12/2022 3.8 3.5 - 5.2 mmol/L Final         Passed - Na in normal range and within 180 days    Sodium  Date Value Ref Range Status  03/12/2022 142 134 - 144 mmol/L Final         Passed - eGFR is 10 or above and within 180 days    GFR calc Af Amer  Date Value Ref Range Status  02/14/2019 69 >59 mL/min/1.73 Final   GFR calc non Af Amer  Date Value Ref Range Status  02/14/2019 60 >59 mL/min/1.73 Final   eGFR  Date Value Ref Range Status  03/12/2022 60 >59 mL/min/1.73 Final         Passed - Patient is not pregnant      Passed - Last BP in normal range    BP Readings from Last 1 Encounters:  03/12/22 128/82         Passed - Valid encounter within last 6 months    Recent Outpatient Visits           2 months ago 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, without long-term current use of insulin (HCC)   [place] Jacky Kindle, FNP   8 months ago Annual physical exam   [facility] Mer[facility] Norton T, FNP   1 year ago 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 Chrismon, Jodell Cipro, PA-C       Future Appointments             In 3 weeks Jacky Kindle, FNP [facility], [facility]

## 2022-05-30 ENCOUNTER — Other Ambulatory Visit: Payer: Self-pay | Admitting: Family Medicine

## 2022-05-30 DIAGNOSIS — E119 Type 2 diabetes mellitus without complications: Secondary | ICD-10-CM

## 2022-05-30 DIAGNOSIS — R7303 Prediabetes: Secondary | ICD-10-CM

## 2022-05-30 NOTE — Telephone Encounter (Signed)
Requested Prescriptions  Pending Prescriptions Disp Refills   metFORMIN (GLUCOPHAGE) 500 MG tablet [Pharmacy Med Name: METFORMIN HCL 500 MG TABLET] 90 tablet 0    Sig: TAKE 1 TABLET BY MOUTH EVERY DAY WITH BREAKFAST     Endocrinology:  Diabetes - Biguanides Failed - 05/30/2022  2:23 AM      Failed - Cr in normal range and within 360 days    Creatinine, Ser  Date Value Ref Range Status  03/12/2022 1.34 (H) 0.76 - 1.27 mg/dL Final   Creatinine, POC  Date Value Ref Range Status  01/20/2017 NA mg/dL Final         Failed - B12 Level in normal range and within 720 days    No results found for: "VITAMINB12"       Passed - HBA1C is between 0 and 7.9 and within 180 days    Hemoglobin A1C  Date Value Ref Range Status  03/12/2022 6.6 (A) 4.0 - 5.6 % Final  10/16/2015 6.2  Final   Hgb A1c MFr Bld  Date Value Ref Range Status  09/13/2021 6.4 (H) 4.8 - 5.6 % Final    Comment:             Prediabetes: 5.7 - 6.4          Diabetes: >6.4          Glycemic control for adults with diabetes: <7.0          Passed - eGFR in normal range and within 360 days    GFR calc Af Amer  Date Value Ref Range Status  02/14/2019 69 >59 mL/min/1.73 Final   GFR calc non Af Amer  Date Value Ref Range Status  02/14/2019 60 >59 mL/min/1.73 Final   eGFR  Date Value Ref Range Status  03/12/2022 60 >59 mL/min/1.73 Final         Passed - Valid encounter within last 6 months    Recent Outpatient Visits           2 months ago 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 Merita Norton  T, FNP   1 year ago Diabetes mellitus without complication [facility])   [place] [facility] Bosie Clos, MD   1 year ago Essential hypertension   [place] [facility] Chrismon, Jodell Cipro, PA-C       Future Appointments             In 1 week Jacky Kindle, FNP [place] [facility]            Passed - CBC within normal limits and completed in the last 12 months    WBC  Date Value Ref Range Status  09/13/2021 6.4 3.4 - 10.8 x10E3/uL Final   RBC  Date Value Ref Range Status  09/13/2021 5.58 4.14 - 5.80 x10E6/uL Final   Hemoglobin  Date Value Ref Range Status  09/13/2021 15.2 13.0 - 17.7 g/dL Final   Hematocrit  Date Value Ref Range Status  09/13/2021 46.9 37.5 - 51.0 % Final  MCHC  Date Value Ref Range Status  09/13/2021 32.4 31.5 - 35.7 g/dL Final   [facility]  Date Value Ref Range Status  09/13/2021 27.2 26.6 - 33.0 pg Final   MCV  Date Value Ref Range Status  09/13/2021 84 79 - 97 fL Final   No results found for: "PLTCOUNTKUC", "LABPLAT", "POCPLA" RDW  Date Value Ref Range Status  09/13/2021 13.7 11.6 - 15.4 % Final

## 2022-06-10 ENCOUNTER — Encounter: Payer: Self-pay | Admitting: Family Medicine

## 2022-06-10 ENCOUNTER — Ambulatory Visit (INDEPENDENT_AMBULATORY_CARE_PROVIDER_SITE_OTHER): Payer: 59 | Admitting: Family Medicine

## 2022-06-10 VITALS — BP 144/84 | HR 79 | Ht 70.0 in | Wt 215.0 lb

## 2022-06-10 DIAGNOSIS — E0822 Diabetes mellitus due to underlying condition with diabetic chronic kidney disease: Secondary | ICD-10-CM | POA: Diagnosis not present

## 2022-06-10 DIAGNOSIS — I152 Hypertension secondary to endocrine disorders: Secondary | ICD-10-CM | POA: Diagnosis not present

## 2022-06-10 DIAGNOSIS — I10 Essential (primary) hypertension: Secondary | ICD-10-CM

## 2022-06-10 DIAGNOSIS — N1831 Chronic kidney disease, stage 3a: Secondary | ICD-10-CM | POA: Diagnosis not present

## 2022-06-10 DIAGNOSIS — E1159 Type 2 diabetes mellitus with other circulatory complications: Secondary | ICD-10-CM

## 2022-06-10 DIAGNOSIS — Z6830 Body mass index (BMI) 30.0-30.9, adult: Secondary | ICD-10-CM

## 2022-06-10 DIAGNOSIS — E6609 Other obesity due to excess calories: Secondary | ICD-10-CM | POA: Diagnosis not present

## 2022-06-10 LAB — POCT GLYCOSYLATED HEMOGLOBIN (HGB A1C): Hemoglobin A1C: 6.3 % — AB (ref 4.0–5.6)

## 2022-06-10 MED ORDER — LOSARTAN POTASSIUM-HCTZ 50-12.5 MG PO TABS
2.0000 | ORAL_TABLET | Freq: Every day | ORAL | 1 refills | Status: DC
Start: 2022-06-10 — End: 2022-09-09

## 2022-06-10 MED ORDER — LOSARTAN POTASSIUM-HCTZ 100-25 MG PO TABS: 1.0000 | ORAL_TABLET | Freq: Every day | ORAL | 3 refills | Status: DC

## 2022-06-10 NOTE — Assessment & Plan Note (Signed)
Chronic, remains elevated Recommend titration of hyzaar from 50-12.5 to 100-25 in addition to verapamil 120  Discussed goal 129/79 or less

## 2022-06-10 NOTE — Assessment & Plan Note (Signed)
Chronic, stable Continue to recommend balanced, lower carb meals. Smaller meal size, adding snacks. Choosing water as drink of choice and increasing purposeful exercise. A1c at goal

## 2022-06-10 NOTE — Assessment & Plan Note (Signed)
Chronic, stable Body mass index is 30.85 kg/m. Discussed importance of healthy weight management Discussed diet and exercise

## 2022-06-10 NOTE — Progress Notes (Signed)
Established patient visit   Patient: Francis Gomez   DOB: January 09, 1960   62 y.o. Male  MRN: [id number] Visit Date: 06/10/2022  Today's healthcare provider: Jacky Kindle, FNP  Re Introduced to nurse practitioner role and practice setting.  All questions answered.  Discussed provider/patient relationship and expectations.  Chief Complaint  Patient presents with   Follow-up    DM/HTN   Subjective    HPI HPI     Follow-up    Additional comments: DM/HTN      Last edited by Shelly Bombard, CMA on 06/10/2022  8:03 AM.      Hypertension, follow-up  BP Readings from Last 3 Encounters:  06/10/22 (!) 144/84  03/12/22 128/82  12/18/21 130/78   Wt Readings from Last 3 Encounters:  06/10/22 215 lb (97.5 kg)  03/12/22 219 lb (99.3 kg)  12/18/21 213 lb 4.8 oz (96.8 kg)     He was last seen for hypertension 3 months ago.  BP at that visit was 128/82, home reading. Management since that visit includes hyzaar 50-12.5 and verapamil 120 + farxiga 10.  He reports excellent compliance with treatment. He is not having side effects.  He is following a Regular diet. He is not exercising. He does not smoke.  Use of agents associated with hypertension: none.   Outside blood pressures are only checked at pharmacy ~120-140/80-90s. Symptoms: No chest pain No chest pressure  No palpitations No syncope  No dyspnea No orthopnea  No paroxysmal nocturnal dyspnea No lower extremity edema   Pertinent labs Lab Results  Component Value Date   CHOL 145 03/12/2022   HDL 38 (L) 03/12/2022   LDLCALC 66 03/12/2022   TRIG 254 (H) 03/12/2022   CHOLHDL 3.8 03/12/2022   Lab Results  Component Value Date   NA 142 03/12/2022   K 3.8 03/12/2022   CREATININE 1.34 (H) 03/12/2022   EGFR 60 03/12/2022   GLUCOSE 94 03/12/2022   TSH 2.880 09/13/2021     The 10-year ASCVD risk score (Arnett DK, et al., 2019) is:  29.8%  --------------------------------------------------------------------------------------------------- Diabetes Mellitus Type II, Follow-up  Lab Results  Component Value Date   HGBA1C 6.3 (A) 06/10/2022   HGBA1C 6.6 (A) 03/12/2022   HGBA1C 6.4 (H) 09/13/2021   Wt Readings from Last 3 Encounters:  06/10/22 215 lb (97.5 kg)  03/12/22 219 lb (99.3 kg)  12/18/21 213 lb 4.8 oz (96.8 kg)   Last seen for diabetes 3 months ago.  Management since then includes metformin 500 mg, farxiga 10 mg. He reports excellent compliance with treatment. He is not having side effects.  Symptoms: No fatigue No foot ulcerations  No appetite changes No nausea  No paresthesia of the feet  No polydipsia  No polyuria No visual disturbances   No vomiting     Home blood sugar records: not being checked  Episodes of hypoglycemia? No    Current insulin regiment: none Most Recent Eye Exam: due; encouraged to schedule Current exercise: none Current diet habits: in general, an "unhealthy" diet  Pertinent Labs: Lab Results  Component Value Date   CHOL 145 03/12/2022   HDL 38 (L) 03/12/2022   LDLCALC 66 03/12/2022   TRIG 254 (H) 03/12/2022   CHOLHDL 3.8 03/12/2022   Lab Results  Component Value Date   NA 142 03/12/2022   K 3.8 03/12/2022   CREATININE 1.34 (H) 03/12/2022   EGFR 60 03/12/2022   LABMICR 7.0 09/13/2021   MICRALBCREAT 5 09/13/2021     ---------------------------------------------------------------------------------------------------  Medications: Outpatient Medications Prior to Visit  Medication Sig   atorvastatin (LIPITOR) 80 MG tablet TAKE 1 TABLET BY MOUTH EVERY DAY   FARXIGA 10 MG TABS tablet TAKE 1 TABLET BY MOUTH DAILY BEFORE BREAKFAST.   levothyroxine (SYNTHROID) 25 MCG tablet TAKE 1 TABLET (25 MCG TOTAL) BY MOUTH DAILY-- BEFORE BREAKFAST.   metFORMIN (GLUCOPHAGE) 500 MG tablet TAKE 1 TABLET BY MOUTH EVERY DAY WITH BREAKFAST   omeprazole (PRILOSEC) 40 MG capsule Take  1 capsule (40 mg total) by mouth at bedtime.   verapamil (CALAN) 120 MG tablet TAKE 2 TABLETS (240 MG TOTAL) BY MOUTH DAILY.   [DISCONTINUED] losartan-hydrochlorothiazide (HYZAAR) 50-12.5 MG tablet TAKE 1 TABLET BY MOUTH EVERY DAY   No facility-administered medications prior to visit.    Review of Systems    Objective    BP (!) 144/84   Pulse 79   Ht 5\' 10"  (1.778 m)   Wt 215 lb (97.5 kg)   SpO2 94%   BMI 30.85 kg/m   Physical Exam Vitals and nursing note reviewed.  Constitutional:      Appearance: Normal appearance. He is obese.  HENT:     Head: Normocephalic and atraumatic.  Cardiovascular:     Rate and Rhythm: Normal rate and regular rhythm.     Pulses: Normal pulses.     Heart sounds: Normal heart sounds.  Pulmonary:     Effort: Pulmonary effort is normal.     Breath sounds: Normal breath sounds.  Musculoskeletal:        General: Normal range of motion.     Cervical back: Normal range of motion.  Skin:    General: Skin is warm and dry.     Capillary Refill: Capillary refill takes less than 2 seconds.  Neurological:     General: No focal deficit present.     Mental Status: He is alert and oriented to person, place, and time. Mental status is at baseline.  Psychiatric:        Mood and Affect: Mood normal.        Behavior: Behavior normal.        Thought Content: Thought content normal.        Judgment: Judgment normal.      Results for orders placed or performed in visit on 06/10/22  POCT HgB A1C  Result Value Ref Range   Hemoglobin A1C 6.3 (A) 4.0 - 5.6 %   HbA1c POC (<> result, manual entry)     HbA1c, POC (prediabetic range)     HbA1c, POC (controlled diabetic range)      Assessment & Plan     Problem List Items Addressed This Visit       Cardiovascular and Mediastinum   Hypertension associated with diabetes (HCC)    Chronic, remains elevated Recommend titration of hyzaar from 50-12.5 to 100-25 in addition to verapamil 120  Discussed goal 129/79  or less      Relevant Medications   losartan-hydrochlorothiazide (HYZAAR) 100-25 MG tablet   losartan-hydrochlorothiazide (HYZAAR) 50-12.5 MG tablet     Endocrine   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 Smaller meal size, adding snacks. Choosing water as drink of choice and increasing purposeful exercise. A1c at goal       Relevant Medications   losartan-hydrochlorothiazide (HYZAAR) 100-25 MG tablet   losartan-hydrochlorothiazide (HYZAAR) 50-12.5 MG tablet  Other Relevant Orders   POCT HgB A1C (Completed)     Other   Class 1 obesity due to excess calories with serious comorbidity and body mass index (BMI) of 30.0 to 30.9 in adult    Chronic, stable Body mass index is 30.85 kg/m. Discussed importance of healthy weight management Discussed diet and exercise       Other Visit Diagnoses     Essential hypertension       Relevant Medications   losartan-hydrochlorothiazide (HYZAAR) 100-25 MG tablet   losartan-hydrochlorothiazide (HYZAAR) 50-12.5 MG tablet      Return in about 3 months (around 09/10/2022) for T2DM management.     Leilani Merl, FNP, have reviewed all documentation for this visit. The documentation on 06/10/22 for the exam, diagnosis, procedures, and orders are all accurate and complete.  Jacky Kindle, FNP  [facility] [phone] (phone) [phone] (fax)  [facility]

## 2022-07-30 ENCOUNTER — Other Ambulatory Visit: Payer: Self-pay | Admitting: Family Medicine

## 2022-07-30 DIAGNOSIS — I10 Essential (primary) hypertension: Secondary | ICD-10-CM

## 2022-08-21 ENCOUNTER — Other Ambulatory Visit: Payer: Self-pay | Admitting: Family Medicine

## 2022-08-21 DIAGNOSIS — K219 Gastro-esophageal reflux disease without esophagitis: Secondary | ICD-10-CM

## 2022-08-28 ENCOUNTER — Other Ambulatory Visit: Payer: Self-pay | Admitting: Family Medicine

## 2022-08-28 DIAGNOSIS — E119 Type 2 diabetes mellitus without complications: Secondary | ICD-10-CM

## 2022-08-28 DIAGNOSIS — R7303 Prediabetes: Secondary | ICD-10-CM

## 2022-08-28 NOTE — Telephone Encounter (Signed)
Requested Prescriptions  Pending Prescriptions Disp Refills   metFORMIN (GLUCOPHAGE) 500 MG tablet [Pharmacy Med Name: METFORMIN HCL 500 MG TABLET] 90 tablet 0    Sig: TAKE 1 TABLET BY MOUTH EVERY DAY WITH BREAKFAST     Endocrinology:  Diabetes - Biguanides Failed - 08/28/2022  1:19 AM      Failed - Cr in normal range and within 360 days    Creatinine, Ser  Date Value Ref Range Status  03/12/2022 1.34 (H) 0.76 - 1.27 mg/dL Final   Creatinine, POC  Date Value Ref Range Status  01/20/2017 NA mg/dL Final         Failed - B12 Level in normal range and within 720 days    No results found for: "VITAMINB12"       Passed - HBA1C is between 0 and 7.9 and within 180 days    Hemoglobin A1C  Date Value Ref Range Status  06/10/2022 6.3 (A) 4.0 - 5.6 % Final  10/16/2015 6.2  Final   Hgb A1c MFr Bld  Date Value Ref Range Status  09/13/2021 6.4 (H) 4.8 - 5.6 % Final    Comment:             Prediabetes: 5.7 - 6.4          Diabetes: >6.4          Glycemic control for adults with diabetes: <7.0          Passed - eGFR in normal range and within 360 days    GFR calc Af Amer  Date Value Ref Range Status  02/14/2019 69 >59 mL/min/1.73 Final   GFR calc non Af Amer  Date Value Ref Range Status  02/14/2019 60 >59 mL/min/1.73 Final   eGFR  Date Value Ref Range Status  03/12/2022 60 >59 mL/min/1.73 Final         Passed - Valid encounter within last 6 months    Recent Outpatient Visits           2 months ago 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, without long-term current use of insulin (HCC)   [place] [facility] Merita Norton T, FNP   5 months ago 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, without long-term current use of insulin (HCC)   [facility] Jacky Kindle, FNP   11 months ago Annual physical exam   [facility] Merita Norton T, FNP   1 year ago 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, without long-term current use of insulin (HCC)   [facility] Merita Norton T, FNP   1 year ago Diabetes mellitus without complication [facility])   [facility] Bosie Clos, MD       Future Appointments             In 1 week Jacky Kindle, FNP [facility]            Passed - CBC within normal limits and completed in the last 12 months    WBC  Date Value Ref Range Status  09/13/2021 6.4 3.4 - 10.8 x10E3/uL Final   RBC  Date Value Ref Range Status  09/13/2021 5.58 4.14 - 5.80 x10E6/uL Final   Hemoglobin  Date Value Ref Range Status  09/13/2021 15.2 13.0 - 17.7 g/dL Final  Hematocrit  Date Value Ref Range Status  09/13/2021 46.9 37.5 - 51.0 % Final   MCHC  Date Value Ref Range Status  09/13/2021 32.4 31.5 - 35.7 g/dL Final   [facility]  Date Value Ref Range Status  09/13/2021 27.2 26.6 - 33.0 pg Final   MCV  Date Value Ref Range Status  09/13/2021 84 79 - 97 fL Final   No results found for: "PLTCOUNTKUC", "LABPLAT", "POCPLA" RDW  Date Value Ref Range Status  09/13/2021 13.7 11.6 - 15.4 % Final

## 2022-09-09 ENCOUNTER — Ambulatory Visit (INDEPENDENT_AMBULATORY_CARE_PROVIDER_SITE_OTHER): Payer: 59 | Admitting: Family Medicine

## 2022-09-09 ENCOUNTER — Encounter: Payer: Self-pay | Admitting: Family Medicine

## 2022-09-09 VITALS — BP 130/89 | HR 76 | Temp 97.6°F | Resp 16 | Ht 70.0 in | Wt 215.0 lb

## 2022-09-09 DIAGNOSIS — N1831 Chronic kidney disease, stage 3a: Secondary | ICD-10-CM

## 2022-09-09 DIAGNOSIS — I152 Hypertension secondary to endocrine disorders: Secondary | ICD-10-CM

## 2022-09-09 DIAGNOSIS — E1159 Type 2 diabetes mellitus with other circulatory complications: Secondary | ICD-10-CM

## 2022-09-09 DIAGNOSIS — E1169 Type 2 diabetes mellitus with other specified complication: Secondary | ICD-10-CM | POA: Diagnosis not present

## 2022-09-09 DIAGNOSIS — Z7984 Long term (current) use of oral hypoglycemic drugs: Secondary | ICD-10-CM

## 2022-09-09 DIAGNOSIS — Z23 Encounter for immunization: Secondary | ICD-10-CM | POA: Diagnosis not present

## 2022-09-09 DIAGNOSIS — E039 Hypothyroidism, unspecified: Secondary | ICD-10-CM

## 2022-09-09 DIAGNOSIS — E0822 Diabetes mellitus due to underlying condition with diabetic chronic kidney disease: Secondary | ICD-10-CM

## 2022-09-09 DIAGNOSIS — E785 Hyperlipidemia, unspecified: Secondary | ICD-10-CM

## 2022-09-09 LAB — POCT GLYCOSYLATED HEMOGLOBIN (HGB A1C): Hemoglobin A1C: 6.7 % — AB (ref 4.0–5.6)

## 2022-09-09 MED ORDER — LOSARTAN POTASSIUM-HCTZ 100-25 MG PO TABS: 1.0000 | ORAL_TABLET | Freq: Every day | ORAL | 3 refills | Status: DC

## 2022-09-09 MED ORDER — LEVOTHYROXINE SODIUM 25 MCG PO TABS
ORAL_TABLET | ORAL | 1 refills | Status: DC
Start: 2022-09-09 — End: 2023-03-02

## 2022-09-09 NOTE — Progress Notes (Signed)
Established patient visit   Patient: Francis Gomez   DOB: [birth year]   62 y.o. Male  MRN: [id number] Visit Date: 09/09/2022  Today's healthcare provider: Jacky Kindle, FNP  Introduced to nurse practitioner role and practice setting.  All questions answered.  Discussed provider/patient relationship and expectations.  Subjective    HPI  Patient presents for T2DM and HTN follow up; notes that he would like his flu vaccine.  Eye exam requested from [facility]  Medications: Outpatient Medications Prior to Visit  Medication Sig   atorvastatin (LIPITOR) 80 MG tablet TAKE 1 TABLET BY MOUTH EVERY DAY   FARXIGA 10 MG TABS tablet TAKE 1 TABLET BY MOUTH DAILY BEFORE BREAKFAST.   metFORMIN (GLUCOPHAGE) 500 MG tablet TAKE 1 TABLET BY MOUTH EVERY DAY WITH BREAKFAST   omeprazole (PRILOSEC) 40 MG capsule TAKE 1 CAPSULE BY MOUTH AT BEDTIME.   verapamil (CALAN) 120 MG tablet TAKE 2 TABLETS (240 MG TOTAL) BY MOUTH DAILY.   [DISCONTINUED] levothyroxine (SYNTHROID) 25 MCG tablet TAKE 1 TABLET (25 MCG TOTAL) BY MOUTH DAILY-- BEFORE BREAKFAST.   [DISCONTINUED] losartan-hydrochlorothiazide (HYZAAR) 100-25 MG tablet Take 1 tablet by mouth daily.   [DISCONTINUED] losartan-hydrochlorothiazide (HYZAAR) 50-12.5 MG tablet Take 2 tablets by mouth daily.   No facility-administered medications prior to visit.    Review of Systems    Objective    BP 130/89   Pulse 76   Temp 97.6 F (36.4 C) (Temporal)   Resp 16   Ht 5\' 10"  (1.778 m)   Wt 215 lb (97.5 kg)   SpO2 96%   BMI 30.85 kg/m   BP Readings from Last 3 Encounters:  09/09/22 130/89  06/10/22 (!) 144/84  03/12/22 128/82   Physical Exam Vitals and nursing note reviewed.  Constitutional:      Appearance: Normal appearance. He is obese.  HENT:     Head: Normocephalic and atraumatic.  Cardiovascular:     Rate and Rhythm: Normal rate and regular rhythm.     Pulses: Normal pulses.     Heart sounds: Normal heart sounds.  Pulmonary:      Effort: Pulmonary effort is normal.     Breath sounds: Normal breath sounds.  Musculoskeletal:        General: Normal range of motion.     Cervical back: Normal range of motion.  Skin:    General: Skin is warm and dry.     Capillary Refill: Capillary refill takes less than 2 seconds.  Neurological:     General: No focal deficit present.     Mental Status: He is alert and oriented to person, place, and time. Mental status is at baseline.  Psychiatric:        Mood and Affect: Mood normal.        Behavior: Behavior normal.        Thought Content: Thought content normal.        Judgment: Judgment normal.     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 Smaller meal size, adding snacks. Choosing water as drink of choice and increasing purposeful exercise. Remains under goal of <7%      Relevant Medications   losartan-hydrochlorothiazide (HYZAAR) 100-25 MG tablet   Other Relevant Orders   POCT glycosylated hemoglobin (Hb A1C) (Completed)   Hyperlipidemia associated with type 2 diabetes mellitus (HCC)    Chronic; remains on high intensity statin, lipitor  80 mg No complaints at this time Continue to monitor LDL with goal of <70      Relevant Medications   losartan-hydrochlorothiazide (HYZAAR) 100-25 MG tablet   Hypothyroidism    Chronic; stable Request for refills       Relevant Medications   levothyroxine (SYNTHROID) 25 MCG tablet     Genitourinary   Stage 3a chronic kidney disease (HCC)    Chronic; unknown Defer labs to CPE Continues on farxiga 10 as well as ARB, hyzaar 100-25      Other Visit Diagnoses     Encounter for immunization       Relevant Orders   Flu vaccine trivalent PF, 6mos and older(Flulaval,Afluria,Fluarix,Fluzone) (Completed)      Return in about 3 months (around 12/09/2022) for annual examination.     Leilani Merl, FNP, have reviewed all documentation for this visit. The documentation on 09/09/22 for the exam, diagnosis, procedures, and orders are all accurate and complete.  Jacky Kindle, FNP  [facility] [phone] (phone) [phone] (fax)  [facility]

## 2022-09-09 NOTE — Assessment & Plan Note (Signed)
Chronic; unknown Defer labs to CPE Continues on farxiga 10 as well as ARB, hyzaar 100-25

## 2022-09-09 NOTE — Assessment & Plan Note (Signed)
Chronic, remains borderline Discussed goal of 129/79 to best assist secondary complications from disease processes Continues on  -farxiga 10 -hyzaar 100-24 -verapamil 240 mg (2 tablets)

## 2022-09-09 NOTE — Assessment & Plan Note (Signed)
Chronic; slightly worse Continues only on 500 mg metformin daily with farxiga 10 mg Continue to recommend balanced, lower carb meals. Smaller meal size, adding snacks. Choosing water as drink of choice and increasing purposeful exercise. Remains under goal of <7%

## 2022-09-09 NOTE — Assessment & Plan Note (Signed)
Chronic; stable Request for refills

## 2022-09-09 NOTE — Assessment & Plan Note (Signed)
Chronic; remains on high intensity statin, lipitor 80 mg No complaints at this time Continue to monitor LDL with goal of <70

## 2022-11-12 ENCOUNTER — Other Ambulatory Visit: Payer: Self-pay | Admitting: Family Medicine

## 2022-11-12 DIAGNOSIS — I10 Essential (primary) hypertension: Secondary | ICD-10-CM

## 2022-11-26 ENCOUNTER — Other Ambulatory Visit: Payer: Self-pay | Admitting: Family Medicine

## 2022-11-26 DIAGNOSIS — R7303 Prediabetes: Secondary | ICD-10-CM

## 2022-11-26 DIAGNOSIS — E119 Type 2 diabetes mellitus without complications: Secondary | ICD-10-CM

## 2022-12-10 ENCOUNTER — Encounter: Payer: 59 | Admitting: Family Medicine

## 2022-12-12 ENCOUNTER — Encounter: Payer: Self-pay | Admitting: Family Medicine

## 2022-12-12 ENCOUNTER — Ambulatory Visit (INDEPENDENT_AMBULATORY_CARE_PROVIDER_SITE_OTHER): Payer: 59 | Admitting: Family Medicine

## 2022-12-12 VITALS — BP 113/77 | HR 90 | Resp 16 | Ht 70.0 in | Wt 215.8 lb

## 2022-12-12 DIAGNOSIS — I152 Hypertension secondary to endocrine disorders: Secondary | ICD-10-CM

## 2022-12-12 DIAGNOSIS — Z0000 Encounter for general adult medical examination without abnormal findings: Secondary | ICD-10-CM

## 2022-12-12 DIAGNOSIS — N1831 Chronic kidney disease, stage 3a: Secondary | ICD-10-CM | POA: Diagnosis not present

## 2022-12-12 DIAGNOSIS — E1169 Type 2 diabetes mellitus with other specified complication: Secondary | ICD-10-CM | POA: Diagnosis not present

## 2022-12-12 DIAGNOSIS — Z125 Encounter for screening for malignant neoplasm of prostate: Secondary | ICD-10-CM | POA: Diagnosis not present

## 2022-12-12 DIAGNOSIS — E0822 Diabetes mellitus due to underlying condition with diabetic chronic kidney disease: Secondary | ICD-10-CM

## 2022-12-12 DIAGNOSIS — E1159 Type 2 diabetes mellitus with other circulatory complications: Secondary | ICD-10-CM | POA: Diagnosis not present

## 2022-12-12 DIAGNOSIS — E785 Hyperlipidemia, unspecified: Secondary | ICD-10-CM

## 2022-12-12 DIAGNOSIS — Z0001 Encounter for general adult medical examination with abnormal findings: Secondary | ICD-10-CM | POA: Diagnosis not present

## 2022-12-12 NOTE — Assessment & Plan Note (Addendum)
Chronic, at goal Repeat CBC CMP  Continue hyzaar 100-25, farxiga 10 and verapamil 240 mg daily

## 2022-12-12 NOTE — Progress Notes (Signed)
Complete physical exam   Patient: Francis Gomez   DOB: 02/12/1960   62 y.o. Male  MRN: [id number] Visit Date: 12/12/2022  Today's healthcare provider: Jacky Kindle, FNP  Re Introduced to nurse practitioner role and practice setting.  All questions answered.  Discussed provider/patient relationship and expectations.  Chief Complaint  Patient presents with   Annual Exam   Subjective    [name] is a 62 y.o. male who presents today for a complete physical exam.  He reports consuming a low sodium and drinking a protein shake in the morning  diet.  Walking 1-2 miles up to two times a day.  He generally feels fairly well. He reports sleeping fairly well. He does have additional problems to discuss today.   HPI   The patient presents for a routine physical exam. He reports no significant changes in his health since his last visit in September. He has been trying to follow a low sodium diet and consume protein shakes in the morning. He is physically active, walking about one to two miles daily and spending time doing yard work. He reports no changes in his vision or dental health. He has no current medication refill needs as he recently picked up a prescription. He reports no issues with sleep and overall feels well.  Past Medical History:  Diagnosis Date   Cervical paraspinous muscle spasm    Diabetes mellitus without complication (HCC)    Hypertension    Hypothyroidism    Obesity    Osteoarthritis    left knee   Past Surgical History:  Procedure Laterality Date   COLONOSCOPY WITH PROPOFOL N/A 05/25/2020   Procedure: COLONOSCOPY WITH PROPOFOL;  Surgeon: Pasty Spillers, MD;  Location: [facility] ENDOSCOPY;  Service: Endoscopy;  Laterality: N/A;   COLONOSCOPY WITH PROPOFOL N/A 06/24/2021   Procedure: COLONOSCOPY WITH PROPOFOL;  Surgeon: Toney Reil, MD;  Location: [facility] ENDOSCOPY;  Service: Gastroenterology;  Laterality: N/A;   KNEE SURGERY Left    x's 2    Social History   Socioeconomic History   Marital status: Single    Spouse name: Not on file   Number of children: Not on file   Years of education: Not on file   Highest education level: Not on file  Occupational History   Not on file  Tobacco Use   Smoking status: Never   Smokeless tobacco: Never  Vaping Use   Vaping status: Never Used  Substance and Sexual Activity   Alcohol use: No   Drug use: No   Sexual activity: Not on file  Other Topics Concern   Not on file  Social History Narrative   Not on file   Social Determinants of Health   Financial Resource Strain: Low Risk  (12/18/2021)   Overall Financial Resource Strain (CARDIA)    Difficulty of Paying Living Expenses: Not hard at all  Food Insecurity: No Food Insecurity (12/18/2021)   Hunger Vital Sign    Worried About Running Out of Food in the Last Year: Never true    Ran Out of Food in the Last Year: Never true  Transportation Needs: No Transportation Needs (12/18/2021)   PRAPARE - Administrator, Civil Service (Medical): No    Lack of Transportation (Non-Medical): No  Physical Activity: Sufficiently Active (12/18/2021)   Exercise Vital Sign    Days of Exercise per Week: 7 days    Minutes of Exercise per Session: 30 min  Stress: No Stress Concern  Present (12/18/2021)   [employer] of Occupational Health - Occupational Stress Questionnaire    Feeling of Stress : Not at all  Social Connections: Socially Isolated (12/18/2021)   Social Connection and Isolation Panel [NHANES]    Frequency of Communication with Friends and Family: More than three times a week    Frequency of Social Gatherings with Friends and Family: Once a week    Attends Religious Services: Never    Database administrator or Organizations: No    Attends Banker Meetings: Never    Marital Status: Divorced  Catering manager Violence: Not At Risk (12/18/2021)   Humiliation, Afraid, Rape, and Kick questionnaire     Fear of Current or Ex-Partner: No    Emotionally Abused: No    Physically Abused: No    Sexually Abused: No   Family Status  Relation Name Status   Mother  Alive   Father  Deceased   Sister  Alive   Brother  Alive   Sister  Alive   Sister  Alive   Brother  Alive  No partnership data on file   Family History  Problem Relation Age of Onset   Diabetes Mother    Heart disease Father 59       MI   Heart disease Sister    Allergies  Allergen Reactions   Amlodipine Itching    Patient Care Team: Jacky Kindle, FNP as PCP - General (Family Medicine)   Medications: Outpatient Medications Prior to Visit  Medication Sig   atorvastatin (LIPITOR) 80 MG tablet TAKE 1 TABLET BY MOUTH EVERY DAY   FARXIGA 10 MG TABS tablet TAKE 1 TABLET BY MOUTH DAILY BEFORE BREAKFAST.   levothyroxine (SYNTHROID) 25 MCG tablet TAKE 1 TABLET (25 MCG TOTAL) BY MOUTH DAILY-- BEFORE BREAKFAST.   losartan-hydrochlorothiazide (HYZAAR) 100-25 MG tablet Take 1 tablet by mouth daily.   metFORMIN (GLUCOPHAGE) 500 MG tablet TAKE 1 TABLET BY MOUTH EVERY DAY WITH BREAKFAST   omeprazole (PRILOSEC) 40 MG capsule TAKE 1 CAPSULE BY MOUTH AT BEDTIME.   verapamil (CALAN) 120 MG tablet TAKE 2 TABLETS (240 MG TOTAL) BY MOUTH DAILY.   No facility-administered medications prior to visit.    Objective    BP 113/77 (BP Location: Right Arm, Patient Position: Sitting, Cuff Size: Large)   Pulse 90   Resp 16   Ht 5\' 10"  (1.778 m)   Wt 215 lb 12.8 oz (97.9 kg)   BMI 30.96 kg/m   Physical Exam Vitals and nursing note reviewed.  Constitutional:      General: He is awake. He is not in acute distress.    Appearance: Normal appearance. He is well-developed and well-groomed. He is obese. He is not ill-appearing, toxic-appearing or diaphoretic.  HENT:     Head: Normocephalic and atraumatic.     Jaw: There is normal jaw occlusion. No trismus, tenderness, swelling or pain on movement.     Salivary Glands: Right salivary gland  is not diffusely enlarged or tender. Left salivary gland is not diffusely enlarged or tender.     Right Ear: Hearing, tympanic membrane, ear canal and external ear normal. There is no impacted cerumen.     Left Ear: Hearing, tympanic membrane, ear canal and external ear normal. There is no impacted cerumen.     Nose: Nose normal. No congestion or rhinorrhea.     Right Turbinates: Not enlarged, swollen or pale.     Left Turbinates: Not enlarged, swollen or pale.  Right Sinus: No maxillary sinus tenderness or frontal sinus tenderness.     Left Sinus: No maxillary sinus tenderness or frontal sinus tenderness.     Mouth/Throat:     Lips: Pink.     Mouth: Mucous membranes are moist. No injury, lacerations, oral lesions or angioedema.     Pharynx: Oropharynx is clear. Uvula midline. No pharyngeal swelling, oropharyngeal exudate or posterior oropharyngeal erythema.     Tonsils: No tonsillar exudate or tonsillar abscesses.  Eyes:     General: Lids are normal. Vision grossly intact. Gaze aligned appropriately.        Right eye: No discharge.        Left eye: No discharge.     Extraocular Movements: Extraocular movements intact.     Conjunctiva/sclera: Conjunctivae normal.     Pupils: Pupils are equal, round, and reactive to light.  Neck:     Thyroid: No thyroid mass, thyromegaly or thyroid tenderness.     Vascular: No carotid bruit.     Trachea: Trachea normal. No tracheal tenderness.  Cardiovascular:     Rate and Rhythm: Normal rate and regular rhythm.     Pulses: Normal pulses.          Carotid pulses are 2+ on the right side and 2+ on the left side.      Radial pulses are 2+ on the right side and 2+ on the left side.       Femoral pulses are 2+ on the right side and 2+ on the left side.      Popliteal pulses are 2+ on the right side and 2+ on the left side.       Dorsalis pedis pulses are 2+ on the right side and 2+ on the left side.       Posterior tibial pulses are 2+ on the right side  and 2+ on the left side.     Heart sounds: Normal heart sounds, S1 normal and S2 normal. No murmur heard.    No friction rub. No gallop.  Pulmonary:     Effort: Pulmonary effort is normal. No respiratory distress.     Breath sounds: Normal breath sounds and air entry. No stridor. No wheezing, rhonchi or rales.  Chest:     Chest wall: No tenderness.  Abdominal:     General: Abdomen is flat. Bowel sounds are normal. There is no distension.     Palpations: Abdomen is soft. There is no mass.     Tenderness: There is no abdominal tenderness. There is no guarding or rebound.     Hernia: No hernia is present.  Genitourinary:    Comments: Exam deferred; denies complaints Musculoskeletal:        General: No swelling, tenderness, deformity or signs of injury. Normal range of motion.     Cervical back: Normal range of motion and neck supple. No rigidity or tenderness.     Right lower leg: No edema.     Left lower leg: No edema.  Feet:     Right foot:     Protective Sensation: 10 sites tested.  10 sites sensed.     Left foot:     Protective Sensation: 10 sites tested.  10 sites sensed.     Comments: DM foot exam completed Lymphadenopathy:     Cervical: No cervical adenopathy.     Right cervical: No superficial, deep or posterior cervical adenopathy.    Left cervical: No superficial, deep or posterior cervical adenopathy.  Skin:  General: Skin is warm and dry.     Capillary Refill: Capillary refill takes less than 2 seconds.     Coloration: Skin is not jaundiced or pale.     Findings: No bruising, erythema, lesion or rash.  Neurological:     General: No focal deficit present.     Mental Status: He is alert and oriented to person, place, and time. Mental status is at baseline.     GCS: GCS eye subscore is 4. GCS verbal subscore is 5. GCS motor subscore is 6.     Sensory: Sensation is intact. No sensory deficit.     Motor: Motor function is intact. No weakness.     Coordination:  Coordination is intact.     Gait: Gait is intact.  Psychiatric:        Attention and Perception: Attention and perception normal.        Mood and Affect: Mood and affect normal.        Speech: Speech normal.        Behavior: Behavior normal. Behavior is cooperative.        Thought Content: Thought content normal.        Cognition and Memory: Cognition normal.        Judgment: Judgment normal.     Last depression screening scores    09/09/2022    8:12 AM 03/12/2022    8:32 AM 12/18/2021    9:37 AM  PHQ 2/9 Scores  PHQ - 2 Score 0 0 0  PHQ- 9 Score 1 1 0   Last fall risk screening    09/09/2022    8:12 AM  Fall Risk   Falls in the past year? 0  Number falls in past yr: 0  Injury with Fall? 0  Risk for fall due to : No Fall Risks  Follow up Falls evaluation completed   Last Audit-C alcohol use screening    03/12/2022    8:32 AM  Alcohol Use Disorder Test (AUDIT)  1. How often do you have a drink containing alcohol? 0  3. How often do you have six or more drinks on one occasion? 0   A score of 3 or more in women, and 4 or more in men indicates increased risk for alcohol abuse, EXCEPT if all of the points are from question 1   No results found for any visits on 12/12/22.  Assessment & Plan    Routine Health Maintenance and Physical Exam  Exercise Activities and Dietary recommendations  Goals      DIET - EAT MORE FRUITS AND VEGETABLES        Immunization History  Administered Date(s) Administered   Influenza, Seasonal, Injecte, Preservative Fre 09/09/2022   Influenza,inj,Quad PF,6+ Mos 03/26/2018, 02/11/2019, 09/01/2019, 03/13/2021, 09/13/2021   Influenza-Unspecified 10/06/2016   MODERNA COVID-19 SARS-COV-2 PEDS BIVALENT BOOSTER 4yr-57yr 03/31/2019, 04/27/2019, 12/27/2019   PNEUMOCOCCAL CONJUGATE-20 11/14/2020   Pfizer(Comirnaty)Fall Seasonal Vaccine 12 years and older 11/04/2021   Rsv, Bivalent, Protein Subunit Rsvpref,pf Verdis Frederickson) 12/05/2021   Tdap 12/29/2006,  03/26/2018   Zoster Recombinant(Shingrix) 11/04/2021, 03/12/2022    Health Maintenance  Topic Date Due   Diabetic kidney evaluation - Urine ACR  09/14/2022   Medicare Annual Wellness (AWV)  12/19/2022   OPHTHALMOLOGY EXAM  03/05/2023   HEMOGLOBIN A1C  03/09/2023   Diabetic kidney evaluation - eGFR measurement  03/12/2023   FOOT EXAM  12/12/2023   DTaP/Tdap/Td (3 - Td or Tdap) 03/25/2028   Colonoscopy  06/25/2031   INFLUENZA  VACCINE  Completed   COVID-19 Vaccine  Completed   Hepatitis C Screening  Completed   HIV Screening  Completed   Zoster Vaccines- Shingrix  Completed   HPV VACCINES  Aged Out    Discussed health benefits of physical activity, and encouraged him to engage in regular exercise appropriate for his age and condition.  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, without long-term current use of insulin (HCC)    Chronic, DM foot exam completed Will repeat A1c and UMACR Pt without concerns  Continue metformin 500 mg every day with farxiga 10 mg On ARB and statin      Relevant Orders   Urine microalbumin-creatinine with uACR   CBC with Differential/Platelet   Comprehensive metabolic panel   Hemoglobin A1c   Lipid panel   PSA, total and free   Hyperlipidemia associated with type 2 diabetes mellitus (HCC)    Chronic, at goal <70 Repeat LP The 10-year ASCVD risk score (Arnett DK, et al., 2019) is: 20.6% Remains on Lipitor 80 mg         Other   Annual physical exam - Primary    Things to do to keep yourself healthy  - Exercise at least 30-45 minutes a day, 3-4 days a week.  - Eat a low-fat diet with lots of fruits and vegetables, up to 7-9 servings per day.  -  Seatbelts can save your life. Wear them always.  - Smoke detectors on every level of your home, check batteries every year.  - Eye Doctor - have an eye exam every 1-2 years  - Safe sex - if you may be exposed to STDs, use a condom.  - Alcohol -  If you drink, do it moderately, less than 2 drinks per day.  - Health Care Power of Attorney. Choose someone to speak for you if you are not able.  - Depression is common in our stressful world.If you're feeling down or losing interest in things you normally enjoy, please come in for a visit.  - Violence - If anyone is threatening or hurting you, please call immediately.       Encounter for screening prostate specific antigen (PSA) measurement    Denies LUTS; recommend PSA in place of DRE. If PSA is elevated for age, we will repeat; if PSA remains elevated pt will be referred to urology for DRE and next steps for best treatment.       Diabetes No changes in vision or dental health. Regular foot exams and maintaining a low sodium diet with protein shakes. Regular exercise with daily walks. -Continue current management plan. -Perform labs today to check blood sugar and kidney function.  Hypertension Blood pressure well controlled. -Continue current management plan.  General Health Maintenance -Perform complete blood count, blood chemistry, PSA value, cholesterol, and urine protein today. -Plan for a six-month follow-up to ensure continued good health.  Return in about 6 months (around 06/12/2023) for T2DM management, chonic disease management.     Leilani Merl, FNP, have reviewed all documentation for this visit. The documentation on 12/12/22 for the exam, diagnosis, procedures, and orders are all accurate and complete.  Jacky Kindle, FNP  [facility] [phone] (phone) [phone] (fax)  [facility]

## 2022-12-12 NOTE — Assessment & Plan Note (Signed)

## 2022-12-12 NOTE — Assessment & Plan Note (Signed)
Denies LUTS; recommend PSA in place of DRE. If PSA is elevated for age, we will repeat; if PSA remains elevated pt will be referred to urology for DRE and next steps for best treatment.  

## 2022-12-12 NOTE — Assessment & Plan Note (Signed)
Chronic, at goal <70 Repeat LP The 10-year ASCVD risk score (Arnett DK, et al., 2019) is: 20.6% Remains on Lipitor 80 mg

## 2022-12-12 NOTE — Assessment & Plan Note (Signed)
Chronic, DM foot exam completed Will repeat A1c and UMACR Pt without concerns  Continue metformin 500 mg every day with farxiga 10 mg On ARB and statin

## 2022-12-13 LAB — MICROALBUMIN / CREATININE URINE RATIO
Creatinine, Urine: 82.8 mg/dL
Microalb/Creat Ratio: 5 mg/g{creat} (ref 0–29)
Microalbumin, Urine: 4.2 ug/mL

## 2022-12-13 LAB — CBC WITH DIFFERENTIAL/PLATELET
Basophils Absolute: 0 10*3/uL (ref 0.0–0.2)
Basos: 0 %
EOS (ABSOLUTE): 0.2 10*3/uL (ref 0.0–0.4)
Eos: 3 %
Hematocrit: 49 % (ref 37.5–51.0)
Hemoglobin: 15.8 g/dL (ref 13.0–17.7)
Immature Grans (Abs): 0 10*3/uL (ref 0.0–0.1)
Immature Granulocytes: 0 %
Lymphocytes Absolute: 2.9 10*3/uL (ref 0.7–3.1)
Lymphs: 42 %
MCH: 27.3 pg (ref 26.6–33.0)
MCHC: 32.2 g/dL (ref 31.5–35.7)
MCV: 85 fL (ref 79–97)
Monocytes Absolute: 0.5 10*3/uL (ref 0.1–0.9)
Monocytes: 8 %
Neutrophils Absolute: 3.1 10*3/uL (ref 1.4–7.0)
Neutrophils: 47 %
Platelets: 273 10*3/uL (ref 150–450)
RBC: 5.79 x10E6/uL (ref 4.14–5.80)
RDW: 14 % (ref 11.6–15.4)
WBC: 6.7 10*3/uL (ref 3.4–10.8)

## 2022-12-13 LAB — COMPREHENSIVE METABOLIC PANEL
ALT: 30 [IU]/L (ref 0–44)
AST: 27 [IU]/L (ref 0–40)
Albumin: 4.2 g/dL (ref 3.9–4.9)
Alkaline Phosphatase: 127 [IU]/L — ABNORMAL HIGH (ref 44–121)
BUN/Creatinine Ratio: 9 — ABNORMAL LOW (ref 10–24)
BUN: 15 mg/dL (ref 8–27)
Bilirubin Total: 0.5 mg/dL (ref 0.0–1.2)
CO2: 26 mmol/L (ref 20–29)
Calcium: 9.7 mg/dL (ref 8.6–10.2)
Chloride: 100 mmol/L (ref 96–106)
Creatinine, Ser: 1.62 mg/dL — ABNORMAL HIGH (ref 0.76–1.27)
Globulin, Total: 3.4 g/dL (ref 1.5–4.5)
Glucose: 80 mg/dL (ref 70–99)
Potassium: 4.1 mmol/L (ref 3.5–5.2)
Sodium: 142 mmol/L (ref 134–144)
Total Protein: 7.6 g/dL (ref 6.0–8.5)
eGFR: 48 mL/min/{1.73_m2} — ABNORMAL LOW (ref >59)

## 2022-12-13 LAB — HEMOGLOBIN A1C
Est. average glucose Bld gHb Est-mCnc: 143 mg/dL
Hgb A1c MFr Bld: 6.6 % — ABNORMAL HIGH (ref 4.8–5.6)

## 2022-12-13 LAB — LIPID PANEL
Chol/HDL Ratio: 3.8 {ratio} (ref 0.0–5.0)
Cholesterol, Total: 135 mg/dL (ref 100–199)
HDL: 36 mg/dL — ABNORMAL LOW (ref >39)
LDL Chol Calc (NIH): 57 mg/dL (ref 0–99)
Triglycerides: 267 mg/dL — ABNORMAL HIGH (ref 0–149)
VLDL Cholesterol Cal: 42 mg/dL — ABNORMAL HIGH (ref 5–40)

## 2022-12-13 LAB — PSA, TOTAL AND FREE
PSA, Free Pct: 32.2 %
PSA, Free: 0.29 ng/mL
Prostate Specific Ag, Serum: 0.9 ng/mL (ref 0.0–4.0)

## 2022-12-15 ENCOUNTER — Other Ambulatory Visit: Payer: Self-pay | Admitting: Family Medicine

## 2022-12-15 DIAGNOSIS — E0822 Diabetes mellitus due to underlying condition with diabetic chronic kidney disease: Secondary | ICD-10-CM

## 2022-12-15 NOTE — Progress Notes (Signed)
Significant increase in creatinine; now up to 1.62 with decrease in eGFR down to 48. Ensure hydration efforts of 48 oz/water/day. Repeat BMP in 2 weeks.  Other labs are normal and stable. Elevated fats remain in blood. Continue to recommend balanced, lower carb meals. Smaller meal size, adding snacks. Choosing water as drink of choice and increasing purposeful exercise.  The 10-year ASCVD risk score (Arnett DK, et al., 2019) is: 20.6%

## 2022-12-23 ENCOUNTER — Ambulatory Visit: Payer: 59

## 2022-12-23 ENCOUNTER — Other Ambulatory Visit: Payer: Self-pay | Admitting: Family Medicine

## 2022-12-23 VITALS — BP 124/80 | Ht 70.0 in | Wt 211.1 lb

## 2022-12-23 DIAGNOSIS — Z0000 Encounter for general adult medical examination without abnormal findings: Secondary | ICD-10-CM

## 2022-12-23 DIAGNOSIS — N1831 Chronic kidney disease, stage 3a: Secondary | ICD-10-CM | POA: Diagnosis not present

## 2022-12-23 NOTE — Progress Notes (Signed)
Subjective:   Francis Gomez is a 62 y.o. male who presents for Medicare Annual/Subsequent preventive examination.  Visit Complete: In person   Cardiac Risk Factors include: advanced age (>34men, >71 women);diabetes mellitus;dyslipidemia;male gender;hypertension;obesity (BMI >30kg/m2)     Objective:    Today's Vitals   12/23/22 0922  BP: 124/80  Weight: 211 lb 1.6 oz (95.8 kg)  Height: 5\' 10"  (1.778 m)   Body mass index is 30.29 kg/m.     12/23/2022    9:34 AM 12/18/2021    9:39 AM 06/24/2021    7:24 AM 05/25/2020    7:19 AM 03/10/2019   11:13 AM  Advanced Directives  Does Patient Have a Medical Advance Directive? No No No No No  Would patient like information on creating a medical advance directive? No - Patient declined No - Patient declined   No - Patient declined    Current Medications (verified) Outpatient Encounter Medications as of 12/23/2022  Medication Sig   atorvastatin (LIPITOR) 80 MG tablet TAKE 1 TABLET BY MOUTH EVERY DAY   FARXIGA 10 MG TABS tablet TAKE 1 TABLET BY MOUTH DAILY BEFORE BREAKFAST.   levothyroxine (SYNTHROID) 25 MCG tablet TAKE 1 TABLET (25 MCG TOTAL) BY MOUTH DAILY-- BEFORE BREAKFAST.   losartan-hydrochlorothiazide (HYZAAR) 100-25 MG tablet Take 1 tablet by mouth daily.   metFORMIN (GLUCOPHAGE) 500 MG tablet TAKE 1 TABLET BY MOUTH EVERY DAY WITH BREAKFAST   omeprazole (PRILOSEC) 40 MG capsule TAKE 1 CAPSULE BY MOUTH AT BEDTIME.   verapamil (CALAN) 120 MG tablet TAKE 2 TABLETS (240 MG TOTAL) BY MOUTH DAILY.   No facility-administered encounter medications on file as of 12/23/2022.    Allergies (verified) Amlodipine   History: Past Medical History:  Diagnosis Date   Cervical paraspinous muscle spasm    Diabetes mellitus without complication (HCC)    Hypertension    Hypothyroidism    Obesity    Osteoarthritis    left knee   Past Surgical History:  Procedure Laterality Date   COLONOSCOPY WITH PROPOFOL N/A 05/25/2020   Procedure:  COLONOSCOPY WITH PROPOFOL;  Surgeon: Pasty Spillers, MD;  Location: [facility] ENDOSCOPY;  Service: Endoscopy;  Laterality: N/A;   COLONOSCOPY WITH PROPOFOL N/A 06/24/2021   Procedure: COLONOSCOPY WITH PROPOFOL;  Surgeon: Toney Reil, MD;  Location: [facility] ENDOSCOPY;  Service: Gastroenterology;  Laterality: N/A;   KNEE SURGERY Left    x's 2   Family History  Problem Relation Age of Onset   Diabetes Mother    Heart disease Father 50       MI   Heart disease Sister    Social History   Socioeconomic History   Marital status: Single    Spouse name: Not on file   Number of children: Not on file   Years of education: Not on file   Highest education level: Not on file  Occupational History   Not on file  Tobacco Use   Smoking status: Never   Smokeless tobacco: Never  Vaping Use   Vaping status: Never Used  Substance and Sexual Activity   Alcohol use: No   Drug use: No   Sexual activity: Not on file  Other Topics Concern   Not on file  Social History Narrative   Not on file   Social Drivers of Health   Financial Resource Strain: Low Risk  (12/23/2022)   Overall Financial Resource Strain (CARDIA)    Difficulty of Paying Living Expenses: Not hard at all  Food Insecurity: No Food Insecurity (  12/23/2022)   Hunger Vital Sign    Worried About Running Out of Food in the Last Year: Never true    Ran Out of Food in the Last Year: Never true  Transportation Needs: No Transportation Needs (12/23/2022)   PRAPARE - Administrator, Civil Service (Medical): No    Lack of Transportation (Non-Medical): No  Physical Activity: Sufficiently Active (12/23/2022)   Exercise Vital Sign    Days of Exercise per Week: 7 days    Minutes of Exercise per Session: 40 min  Stress: No Stress Concern Present (12/23/2022)   [employer] of Occupational Health - Occupational Stress Questionnaire    Feeling of Stress : Not at all  Social Connections: Moderately Isolated (12/23/2022)    Social Connection and Isolation Panel [NHANES]    Frequency of Communication with Friends and Family: Twice a week    Frequency of Social Gatherings with Friends and Family: Once a week    Attends Religious Services: More than 4 times per year    Active Member of [employer] or Organizations: No    Attends Engineer, structural: Never    Marital Status: Divorced    Tobacco Counseling Counseling given: Not Answered   Clinical Intake:  Pre-visit preparation completed: Yes  Pain : No/denies pain     BMI - recorded: 30.29 Nutritional Status: BMI > 30  Obese Nutritional Risks: None Diabetes: Yes CBG done?: No Did pt. bring in CBG monitor from home?: No  How often do you need to have someone help you when you read instructions, pamphlets, or other written materials from your doctor or pharmacy?: 1 - Never  Interpreter Needed?: No  Information entered by :: Kennedy Bucker, LPN   Activities of Daily Living    12/23/2022    9:35 AM 03/12/2022    8:33 AM  In your present state of health, do you have any difficulty performing the following activities:  Hearing? 0 0  Vision? 0 0  Difficulty concentrating or making decisions? 0 0  Walking or climbing stairs? 0 0  Dressing or bathing? 0 0  Doing errands, shopping? 0 0  Preparing Food and eating ? N   Using the Toilet? N   In the past six months, have you accidently leaked urine? N   Do you have problems with loss of bowel control? N   Managing your Medications? N   Managing your Finances? N   Housekeeping or managing your Housekeeping? N     Patient Care Team: Jacky Kindle, FNP as PCP - General (Family Medicine) Pa, [facility]  Indicate any recent Medical Services you may have received from other than [facility] providers in the past year (date may be approximate).     Assessment:   This is a routine wellness examination for Francis Gomez.  Hearing/Vision screen Hearing Screening - Comments:: NO AIDS Vision  Screening - Comments:: NO GLASSES- [facility]   Goals Addressed             This Visit's Progress    DIET - INCREASE WATER INTAKE        Depression Screen    12/23/2022    9:32 AM 09/09/2022    8:12 AM 03/12/2022    8:32 AM 12/18/2021    9:37 AM 09/13/2021    8:42 AM 11/14/2020    8:35 AM 07/13/2020    8:25 AM  PHQ 2/9 Scores  PHQ - 2 Score 0 0 0 0 0 0 1  PHQ- 9 Score 0 1 1 0 1 1 3     Fall Risk    12/23/2022    9:35 AM 09/09/2022    8:12 AM 03/12/2022    8:32 AM 12/18/2021    9:39 AM 09/13/2021    8:42 AM  Fall Risk   Falls in the past year? 0 0 0 0 0  Number falls in past yr: 0 0 0 0 0  Injury with Fall? 0 0 0 0 0  Risk for fall due to : No Fall Risks No Fall Risks  No Fall Risks No Fall Risks  Follow up Falls prevention discussed;Falls evaluation completed Falls evaluation completed  Falls prevention discussed;Falls evaluation completed Falls evaluation completed    MEDICARE RISK AT HOME: Medicare Risk at Home Any stairs in or around the home?: Yes If so, are there any without handrails?: No Home free of loose throw rugs in walkways, pet beds, electrical cords, etc?: Yes Adequate lighting in your home to reduce risk of falls?: Yes Life alert?: No Use of a cane, walker or w/c?: No Grab bars in the bathroom?: Yes Shower chair or bench in shower?: Yes Elevated toilet seat or a handicapped toilet?: No  TIMED UP AND GO:  Was the test performed?  Yes  Length of time to ambulate 10 feet: 4 sec Gait steady and fast without use of assistive device    Cognitive Function:        12/23/2022    9:36 AM 12/18/2021    9:40 AM  6CIT Screen  What Year? 0 points 0 points  What month? 0 points 0 points  What time? 0 points 0 points  Count back from 20 0 points 0 points  Months in reverse 0 points 0 points  Repeat phrase 0 points 0 points  Total Score 0 points 0 points    Immunizations Immunization History  Administered Date(s) Administered   Influenza, Seasonal,  Injecte, Preservative Fre 09/09/2022   Influenza,inj,Quad PF,6+ Mos 03/26/2018, 02/11/2019, 09/01/2019, 03/13/2021, 09/13/2021   Influenza-Unspecified 10/06/2016   MODERNA COVID-19 SARS-COV-2 PEDS BIVALENT BOOSTER 35yr-15yr 03/31/2019, 04/27/2019, 12/27/2019   PNEUMOCOCCAL CONJUGATE-20 11/14/2020   Pfizer(Comirnaty)Fall Seasonal Vaccine 12 years and older 11/04/2021   Rsv, Bivalent, Protein Subunit Rsvpref,pf Verdis Frederickson) 12/05/2021   Tdap 12/29/2006, 03/26/2018   Zoster Recombinant(Shingrix) 11/04/2021, 03/12/2022    TDAP status: Up to date  Flu Vaccine status: Up to date  Pneumococcal vaccine status: Up to date  Covid-19 vaccine status: Completed vaccines  Qualifies for Shingles Vaccine? Yes   Zostavax completed No   Shingrix Completed?: Yes  Screening Tests Health Maintenance  Topic Date Due   COVID-19 Vaccine (5 - 2024-25 season) 09/07/2022   OPHTHALMOLOGY EXAM  03/05/2023   HEMOGLOBIN A1C  06/12/2023   Diabetic kidney evaluation - eGFR measurement  12/12/2023   Diabetic kidney evaluation - Urine ACR  12/12/2023   FOOT EXAM  12/12/2023   Medicare Annual Wellness (AWV)  12/23/2023   DTaP/Tdap/Td (3 - Td or Tdap) 03/25/2028   Colonoscopy  06/25/2031   Pneumococcal Vaccine 48-12 Years old  Completed   INFLUENZA VACCINE  Completed   Hepatitis C Screening  Completed   HIV Screening  Completed   Zoster Vaccines- Shingrix  Completed   HPV VACCINES  Aged Out    Health Maintenance  Health Maintenance Due  Topic Date Due   COVID-19 Vaccine (5 - 2024-25 season) 09/07/2022    Colorectal cancer screening: Type of screening: Colonoscopy. Completed 06/24/21. Repeat every 10 years  Lung Cancer Screening: (Low Dose CT Chest recommended if Age 44-80 years, 20 pack-year currently smoking OR have quit w/in 15years.) does not qualify.     Additional Screening:  Hepatitis C Screening: does qualify; Completed 01/20/17  Vision Screening: Recommended annual ophthalmology exams for  early detection of glaucoma and other disorders of the eye. Is the patient up to date with their annual eye exam?  Yes  Who is the provider or what is the name of the office in which the patient attends annual eye exams? [facility] [place] If pt is not established with a provider, would they like to be referred to a provider to establish care? No .   Dental Screening: Recommended annual dental exams for proper oral hygiene  Diabetic Foot Exam: Diabetic Foot Exam: Completed 12/12/22  Community Resource Referral / Chronic Care Management: CRR required this visit?  No   CCM required this visit?  No     Plan:     I have personally reviewed and noted the following in the patient's chart:   Medical and social history Use of alcohol, tobacco or illicit drugs  Current medications and supplements including opioid prescriptions. Patient is not currently taking opioid prescriptions. Functional ability and status Nutritional status Physical activity Advanced directives List of other physicians Hospitalizations, surgeries, and ER visits in previous 12 months Vitals Screenings to include cognitive, depression, and falls Referrals and appointments  In addition, I have reviewed and discussed with patient certain preventive protocols, quality metrics, and best practice recommendations. A written personalized care plan for preventive services as well as general preventive health recommendations were provided to patient.     Hal Hope, LPN   [id number]   After Visit Summary: (In Person-Declined) Patient declined AVS at this time.  Nurse Notes: NONE

## 2022-12-23 NOTE — Patient Instructions (Addendum)
Mr. Kiesewetter , Thank you for taking time to come for your Medicare Wellness Visit. I appreciate your ongoing commitment to your health goals. Please review the following plan we discussed and let me know if I can assist you in the future.   Referrals/Orders/Follow-Ups/Clinician Recommendations: NONE  This is a list of the screening recommended for you and due dates:  Health Maintenance  Topic Date Due   COVID-19 Vaccine (5 - 2024-25 season) 09/07/2022   Eye exam for diabetics  03/05/2023   Hemoglobin A1C  06/12/2023   Yearly kidney function blood test for diabetes  12/12/2023   Yearly kidney health urinalysis for diabetes  12/12/2023   Complete foot exam   12/12/2023   Medicare Annual Wellness Visit  12/23/2023   DTaP/Tdap/Td vaccine (3 - Td or Tdap) 03/25/2028   Colon Cancer Screening  06/25/2031   Pneumococcal Vaccination  Completed   Flu Shot  Completed   Hepatitis C Screening  Completed   HIV Screening  Completed   Zoster (Shingles) Vaccine  Completed   HPV Vaccine  Aged Out    Advanced directives: (ACP Link)Information on Advanced Care Planning can be found at [facility] [place] Advance Health Care Directives Advance Health Care Directives ([email or web address]   Next Medicare Annual Wellness Visit scheduled for next year: Yes   12/29/23 @ 9:20 AM IN PERSON

## 2022-12-24 LAB — BASIC METABOLIC PANEL
BUN/Creatinine Ratio: 12 (ref 10–24)
BUN: 19 mg/dL (ref 8–27)
CO2: 24 mmol/L (ref 20–29)
Calcium: 9.8 mg/dL (ref 8.6–10.2)
Chloride: 100 mmol/L (ref 96–106)
Creatinine, Ser: 1.6 mg/dL — ABNORMAL HIGH (ref 0.76–1.27)
Glucose: 94 mg/dL (ref 70–99)
Potassium: 3.9 mmol/L (ref 3.5–5.2)
Sodium: 140 mmol/L (ref 134–144)
eGFR: 48 mL/min/{1.73_m2} — ABNORMAL LOW (ref >59)

## 2022-12-24 NOTE — Progress Notes (Signed)
Consistent elevation in creatinine; recommend consult with nephrology to assist.

## 2022-12-29 ENCOUNTER — Other Ambulatory Visit: Payer: Self-pay | Admitting: Nephrology

## 2022-12-29 DIAGNOSIS — N1831 Chronic kidney disease, stage 3a: Secondary | ICD-10-CM | POA: Diagnosis not present

## 2022-12-29 DIAGNOSIS — I10 Essential (primary) hypertension: Secondary | ICD-10-CM | POA: Diagnosis not present

## 2022-12-29 DIAGNOSIS — E1122 Type 2 diabetes mellitus with diabetic chronic kidney disease: Secondary | ICD-10-CM | POA: Diagnosis not present

## 2023-01-13 ENCOUNTER — Ambulatory Visit: Payer: 59

## 2023-01-15 ENCOUNTER — Ambulatory Visit: Payer: 59

## 2023-01-15 ENCOUNTER — Ambulatory Visit
Admission: RE | Admit: 2023-01-15 | Discharge: 2023-01-15 | Disposition: A | Discharge status: Home / Self Care | Payer: 59 | Source: Ambulatory Visit | Attending: Nephrology | Admitting: Nephrology

## 2023-01-15 DIAGNOSIS — N2889 Other specified disorders of kidney and ureter: Secondary | ICD-10-CM | POA: Diagnosis not present

## 2023-01-15 DIAGNOSIS — I10 Essential (primary) hypertension: Secondary | ICD-10-CM | POA: Insufficient documentation

## 2023-01-15 DIAGNOSIS — N1831 Chronic kidney disease, stage 3a: Secondary | ICD-10-CM | POA: Insufficient documentation

## 2023-01-15 DIAGNOSIS — E1122 Type 2 diabetes mellitus with diabetic chronic kidney disease: Secondary | ICD-10-CM | POA: Insufficient documentation

## 2023-01-15 DIAGNOSIS — N189 Chronic kidney disease, unspecified: Secondary | ICD-10-CM | POA: Diagnosis not present

## 2023-01-15 DIAGNOSIS — N1330 Unspecified hydronephrosis: Secondary | ICD-10-CM | POA: Diagnosis not present

## 2023-01-23 ENCOUNTER — Telehealth: Payer: Self-pay | Admitting: Family Medicine

## 2023-01-23 NOTE — Telephone Encounter (Signed)
[facility] verapamil (CALAN) 120 MG tablet   Please advise

## 2023-01-26 ENCOUNTER — Other Ambulatory Visit: Payer: Self-pay | Admitting: Family Medicine

## 2023-01-26 DIAGNOSIS — I10 Essential (primary) hypertension: Secondary | ICD-10-CM

## 2023-01-26 MED ORDER — VERAPAMIL HCL 120 MG PO TABS: 240.0000 mg | ORAL_TABLET | Freq: Every day | ORAL | 1 refills | Status: DC

## 2023-01-26 NOTE — Telephone Encounter (Signed)
Refills sent for verapamil 120mg  tabs (pt takes 240mg  daily)

## 2023-02-16 DIAGNOSIS — N1831 Chronic kidney disease, stage 3a: Secondary | ICD-10-CM | POA: Diagnosis not present

## 2023-02-16 DIAGNOSIS — E1122 Type 2 diabetes mellitus with diabetic chronic kidney disease: Secondary | ICD-10-CM | POA: Diagnosis not present

## 2023-02-16 DIAGNOSIS — I10 Essential (primary) hypertension: Secondary | ICD-10-CM | POA: Diagnosis not present

## 2023-02-23 ENCOUNTER — Telehealth: Payer: Self-pay | Admitting: Family Medicine

## 2023-02-23 NOTE — Telephone Encounter (Signed)
[facility] refill metFORMIN (GLUCOPHAGE) 500 MG tablet   Please advise

## 2023-02-25 ENCOUNTER — Telehealth: Payer: Self-pay | Admitting: Family Medicine

## 2023-02-25 NOTE — Telephone Encounter (Signed)
[facility] request for the following medications:  atorvastatin (LIPITOR) 80 MG tablet   Please advise.  

## 2023-02-26 ENCOUNTER — Other Ambulatory Visit: Payer: Self-pay

## 2023-02-26 DIAGNOSIS — E119 Type 2 diabetes mellitus without complications: Secondary | ICD-10-CM

## 2023-02-26 DIAGNOSIS — R7303 Prediabetes: Secondary | ICD-10-CM

## 2023-02-26 MED ORDER — ATORVASTATIN CALCIUM 80 MG PO TABS: 80.0000 mg | ORAL_TABLET | Freq: Every day | ORAL | 0 refills | Status: DC

## 2023-02-26 MED ORDER — METFORMIN HCL 500 MG PO TABS: ORAL_TABLET | ORAL | 0 refills | Status: DC

## 2023-03-02 ENCOUNTER — Telehealth: Payer: Self-pay | Admitting: Family Medicine

## 2023-03-02 DIAGNOSIS — E039 Hypothyroidism, unspecified: Secondary | ICD-10-CM

## 2023-03-02 DIAGNOSIS — N1831 Chronic kidney disease, stage 3a: Secondary | ICD-10-CM

## 2023-03-02 MED ORDER — LEVOTHYROXINE SODIUM 25 MCG PO TABS: ORAL_TABLET | ORAL | 0 refills | Status: DC

## 2023-03-02 MED ORDER — LOSARTAN POTASSIUM-HCTZ 100-25 MG PO TABS: 1.0000 | ORAL_TABLET | Freq: Every day | ORAL | 0 refills | Status: DC

## 2023-03-02 MED ORDER — DAPAGLIFLOZIN PROPANEDIOL 10 MG PO TABS: 10.0000 mg | ORAL_TABLET | Freq: Every day | ORAL | 0 refills | Status: DC

## 2023-03-02 NOTE — Telephone Encounter (Signed)
 [facility] losartan-hydrochlorothiazide (HYZAAR) 100-25 MG tablet   & levothyroxine (SYNTHROID) 25 MCG tablet  & FARXIGA 10 MG TABS tablet  Please advise

## 2023-03-02 NOTE — Telephone Encounter (Signed)
 Did not fill thyroid, no recent lab on file?

## 2023-03-02 NOTE — Telephone Encounter (Signed)
 90 day supply of synthroid sent to pharmacy, pt needs appointment in next 3 months for labs and additional refills

## 2023-03-02 NOTE — Addendum Note (Signed)
 Addended by: Bing Neighbors on: 03/02/2023 01:06 PM   Modules accepted: Orders

## 2023-03-02 NOTE — Telephone Encounter (Signed)
 Pt notified, appt scheduled

## 2023-03-03 ENCOUNTER — Telehealth: Payer: Self-pay

## 2023-03-03 NOTE — Telephone Encounter (Signed)
 Copied from CRM [id number]. Topic: Clinical - Medication Question >> Mar 03, 2023 10:17 AM [name] wrote: Reason for CRM: The patient is calling regarding a new prescription he picked up yesterday Francis Gomez) ... He said that he does not know why it was ordered or how to take it.

## 2023-03-03 NOTE — Telephone Encounter (Signed)
 Patient was advised to take the Marcelline Deist that was prescribed 10 mg daily before breakfast. Patient verbalized understanding.

## 2023-03-04 ENCOUNTER — Telehealth: Payer: Self-pay | Admitting: Family Medicine

## 2023-03-04 NOTE — Telephone Encounter (Signed)
 [facility] request for the following medications:   losartan-hydrochlorothiazide (HYZAAR) 50-12.5 MG tablet    Not on current medication list  Please advise.

## 2023-03-05 MED ORDER — LOSARTAN POTASSIUM-HCTZ 100-25 MG PO TABS: 1.0000 | ORAL_TABLET | Freq: Every day | ORAL | 1 refills | Status: DC

## 2023-03-19 ENCOUNTER — Other Ambulatory Visit: Payer: Self-pay | Admitting: Family Medicine

## 2023-03-19 DIAGNOSIS — E119 Type 2 diabetes mellitus without complications: Secondary | ICD-10-CM

## 2023-03-19 DIAGNOSIS — R7303 Prediabetes: Secondary | ICD-10-CM

## 2023-03-19 NOTE — Telephone Encounter (Signed)
 Copied from CRM [id number]. Topic: Clinical - Medication Refill >> Mar 19, 2023  4:26 PM Alessandra Bevels wrote: Most Recent Primary Care Visit:  Provider: Hal Hope  Department: [facility]  Visit Type: MEDICARE AWV, SEQUENTIAL  Date: 12/23/2022  [facility] is calling to report that the last fill 11/25/21 & 11/25/21 90 day. Reporting that the 30 day script was not filled on 02/26/2023  Medication: atorvastatin (LIPITOR) 80 MG tablet [[id number]]  metFORMIN (GLUCOPHAGE) 500 MG tablet [[id number]]  Request 90 day script  Has the patient contacted their pharmacy? Yes (Agent: If no, request that the patient contact the pharmacy for the refill. If patient does not wish to contact the pharmacy document the reason why and proceed with request.) (Agent: If yes, when and what did the pharmacy advise?)  Is this the correct pharmacy for this prescription? Yes If no, delete pharmacy and type the correct one.  This is the patient's preferred pharmacy:  [id number] [place] [place] [facility] [address] [facility] [address] [place] [address] Phone: [phone] Fax: [phone]   Has the prescription been filled recently? Yes  Is the patient out of the medication? Yes  Has the patient been seen for an appointment in the last year OR does the patient have an upcoming appointment? Yes  Can we respond through MyChart? Yes  Agent: Please be advised that Rx refills may take up to 3 business days. We ask that you follow-up with your pharmacy.

## 2023-03-20 MED ORDER — METFORMIN HCL 500 MG PO TABS: ORAL_TABLET | ORAL | 0 refills | Status: DC

## 2023-03-20 MED ORDER — ATORVASTATIN CALCIUM 80 MG PO TABS: 80.0000 mg | ORAL_TABLET | Freq: Every day | ORAL | 0 refills | Status: DC

## 2023-03-20 NOTE — Telephone Encounter (Signed)
 Requested Prescriptions  Pending Prescriptions Disp Refills   atorvastatin (LIPITOR) 80 MG tablet 90 tablet 0    Sig: Take 1 tablet (80 mg total) by mouth daily.     Cardiovascular:  Antilipid - Statins Failed - 03/20/2023 12:33 PM      Failed - Lipid Panel in normal range within the last 12 months    Cholesterol, Total  Date Value Ref Range Status  12/12/2022 135 100 - 199 mg/dL Final   LDL Chol Calc (NIH)  Date Value Ref Range Status  12/12/2022 57 0 - 99 mg/dL Final   HDL  Date Value Ref Range Status  12/12/2022 36 (L) >39 mg/dL Final   Triglycerides  Date Value Ref Range Status  12/12/2022 267 (H) 0 - 149 mg/dL Final         Passed - Patient is not pregnant      Passed - Valid encounter within last 12 months    Recent Outpatient Visits           3 months ago Annual physical exam   [facility] Merita Norton T, FNP   6 months ago 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, without long-term current use of insulin (HCC)   [place] [facility] Merita Norton T, FNP   9 months ago 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, without long-term current use of insulin (HCC)   [place] [facility] Merita Norton T, FNP   1 year ago 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, without long-term current use of insulin (HCC)   [place] [facility] Merita Norton T, FNP   1 year ago Annual physical exam   [place] [facility] Merita Norton T, FNP       Future Appointments             In 2 months Simmons-Robinson, Tawanna Cooler, MD [place]             metFORMIN (GLUCOPHAGE) 500 MG tablet 90 tablet 0    Sig: TAKE 1 TABLET BY MOUTH EVERY DAY WITH BREAKFAST     Endocrinology:  Diabetes - Biguanides Failed - 03/20/2023 12:33 PM       Failed - Cr in normal range and within 360 days    Creatinine, Ser  Date Value Ref Range Status  12/23/2022 1.60 (H) 0.76 - 1.27 mg/dL Final   Creatinine, POC  Date Value Ref Range Status  01/20/2017 NA mg/dL Final         Failed - eGFR in normal range and within 360 days    GFR calc Af Amer  Date Value Ref Range Status  02/14/2019 69 >59 mL/min/1.73 Final   GFR calc non Af Amer  Date Value Ref Range Status  02/14/2019 60 >59 mL/min/1.73 Final   eGFR  Date Value Ref Range Status  12/23/2022 48 (L) >59 mL/min/1.73 Final         Failed - B12 Level in normal range and within 720 days    No results found for: "VITAMINB12"       Passed - HBA1C is between 0 and 7.9 and within 180 days    Hemoglobin A1C  Date Value Ref Range Status  10/16/2015 6.2  Final   Hgb A1c MFr Bld  Date Value Ref Range Status  12/12/2022 6.6 (H) 4.8 - 5.6 %  Final    Comment:             Prediabetes: 5.7 - 6.4          Diabetes: >6.4          Glycemic control for adults with diabetes: <7.0          Passed - Valid encounter within last 6 months    Recent Outpatient Visits           3 months ago Annual physical exam   [facility] Merita Norton T, FNP   6 months ago 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, without long-term current use of insulin [facility])   [place] Merita Norton T, FNP   9 months ago 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, without long-term current use of insulin [facility])   [place] [facility] Merita Norton T, FNP   1 year ago 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, without long-term current use of insulin (HCC)   [place] [facility] Jacky Kindle, FNP   1 year ago Annual physical exam   [facility] Jacky Kindle, FNP       Future  Appointments             In 2 months Simmons-Robinson, Tawanna Cooler, MD [facility], [facility]            Passed - CBC within normal limits and completed in the last 12 months    WBC  Date Value Ref Range Status  12/12/2022 6.7 3.4 - 10.8 x10E3/uL Final   RBC  Date Value Ref Range Status  12/12/2022 5.79 4.14 - 5.80 x10E6/uL Final   Hemoglobin  Date Value Ref Range Status  12/12/2022 15.8 13.0 - 17.7 g/dL Final   Hematocrit  Date Value Ref Range Status  12/12/2022 49.0 37.5 - 51.0 % Final   MCHC  Date Value Ref Range Status  12/12/2022 32.2 31.5 - 35.7 g/dL Final   [facility]  Date Value Ref Range Status  12/12/2022 27.3 26.6 - 33.0 pg Final   MCV  Date Value Ref Range Status  12/12/2022 85 79 - 97 fL Final   No results found for: "PLTCOUNTKUC", "LABPLAT", "POCPLA" RDW  Date Value Ref Range Status  12/12/2022 14.0 11.6 - 15.4 % Final

## 2023-04-06 ENCOUNTER — Other Ambulatory Visit: Payer: Self-pay | Admitting: Family Medicine

## 2023-04-06 DIAGNOSIS — N1831 Chronic kidney disease, stage 3a: Secondary | ICD-10-CM

## 2023-04-07 NOTE — Telephone Encounter (Signed)
 Please submit prior authorization for patient to continue farxiga 10mg  daily for diabetes and CKD stage 3, patient has been on this medication for 2years, helping to control diabetes and preserve renal function   Lab Results  Component Value Date   HGBA1C 6.6 (H) 12/12/2022

## 2023-04-07 NOTE — Telephone Encounter (Signed)
 Requested medication (s) are due for refill today: see below  Requested medication (s) are on the active medication list: yes  Last refill:  03/02/23  Future visit scheduled: yes  Notes to clinic:  requesting change from generic to brand name Pharmacy comment: Alternative Requested:THE PRESCRIBED MEDICATION IS NOT COVERED BY INSURANCE. PLEASE CONSIDER CHANGING TO ONE OF THE SUGGESTED COVERED ALTERNATIVES.      Requested Prescriptions  Pending Prescriptions Disp Refills   FARXIGA 10 MG TABS tablet [Pharmacy Med Name: FARXIGA 10 MG TABLET]  0     Endocrinology:  Diabetes - SGLT2 Inhibitors Failed - 04/07/2023 11:23 AM      Failed - Cr in normal range and within 360 days    Creatinine, Ser  Date Value Ref Range Status  12/23/2022 1.60 (H) 0.76 - 1.27 mg/dL Final   Creatinine, POC  Date Value Ref Range Status  01/20/2017 NA mg/dL Final         Failed - eGFR in normal range and within 360 days    GFR calc Af Amer  Date Value Ref Range Status  02/14/2019 69 >59 mL/min/1.73 Final   GFR calc non Af Amer  Date Value Ref Range Status  02/14/2019 60 >59 mL/min/1.73 Final   eGFR  Date Value Ref Range Status  12/23/2022 48 (L) >59 mL/min/1.73 Final         Failed - Valid encounter within last 6 months    Recent Outpatient Visits   None     Future Appointments             In 2 months Simmons-Robinson, [place], MD [place] [facility]            Passed - HBA1C is between 0 and 7.9 and within 180 days    Hemoglobin A1C  Date Value Ref Range Status  10/16/2015 6.2  Final   Hgb A1c MFr Bld  Date Value Ref Range Status  12/12/2022 6.6 (H) 4.8 - 5.6 % Final    Comment:             Prediabetes: 5.7 - 6.4          Diabetes: >6.4          Glycemic control for adults with diabetes: <7.0

## 2023-04-08 ENCOUNTER — Telehealth: Payer: Self-pay | Admitting: Family Medicine

## 2023-04-08 NOTE — Telephone Encounter (Signed)
[facility] for the following medications:  losartan-hydrochlorothiazide (HYZAAR) 100-25 MG tablet   Please advise.

## 2023-04-08 NOTE — Telephone Encounter (Signed)
 Refill request too soon. Patient should have enough.

## 2023-05-04 ENCOUNTER — Other Ambulatory Visit: Payer: Self-pay | Admitting: Family Medicine

## 2023-05-04 DIAGNOSIS — I10 Essential (primary) hypertension: Secondary | ICD-10-CM

## 2023-05-11 DIAGNOSIS — N1831 Chronic kidney disease, stage 3a: Secondary | ICD-10-CM | POA: Diagnosis not present

## 2023-05-11 DIAGNOSIS — E1122 Type 2 diabetes mellitus with diabetic chronic kidney disease: Secondary | ICD-10-CM | POA: Diagnosis not present

## 2023-05-11 DIAGNOSIS — I10 Essential (primary) hypertension: Secondary | ICD-10-CM | POA: Diagnosis not present

## 2023-06-08 ENCOUNTER — Ambulatory Visit: Payer: 59 | Admitting: Family Medicine

## 2023-06-08 ENCOUNTER — Encounter: Payer: Self-pay | Admitting: Family Medicine

## 2023-06-08 VITALS — BP 114/80 | HR 74 | Ht 70.0 in | Wt 204.0 lb

## 2023-06-08 DIAGNOSIS — E785 Hyperlipidemia, unspecified: Secondary | ICD-10-CM | POA: Diagnosis not present

## 2023-06-08 DIAGNOSIS — N1831 Chronic kidney disease, stage 3a: Secondary | ICD-10-CM

## 2023-06-08 DIAGNOSIS — E119 Type 2 diabetes mellitus without complications: Secondary | ICD-10-CM

## 2023-06-08 DIAGNOSIS — E1169 Type 2 diabetes mellitus with other specified complication: Secondary | ICD-10-CM | POA: Diagnosis not present

## 2023-06-08 DIAGNOSIS — I152 Hypertension secondary to endocrine disorders: Secondary | ICD-10-CM

## 2023-06-08 DIAGNOSIS — E1159 Type 2 diabetes mellitus with other circulatory complications: Secondary | ICD-10-CM

## 2023-06-08 DIAGNOSIS — E039 Hypothyroidism, unspecified: Secondary | ICD-10-CM

## 2023-06-08 DIAGNOSIS — K219 Gastro-esophageal reflux disease without esophagitis: Secondary | ICD-10-CM

## 2023-06-08 DIAGNOSIS — I10 Essential (primary) hypertension: Secondary | ICD-10-CM | POA: Diagnosis not present

## 2023-06-08 DIAGNOSIS — R7303 Prediabetes: Secondary | ICD-10-CM

## 2023-06-08 DIAGNOSIS — E0822 Diabetes mellitus due to underlying condition with diabetic chronic kidney disease: Secondary | ICD-10-CM

## 2023-06-08 MED ORDER — DAPAGLIFLOZIN PROPANEDIOL 10 MG PO TABS
10.0000 mg | ORAL_TABLET | Freq: Every day | ORAL | 3 refills | Status: DC
Start: 2023-06-08 — End: 2023-10-09

## 2023-06-08 MED ORDER — ATORVASTATIN CALCIUM 80 MG PO TABS
80.0000 mg | ORAL_TABLET | Freq: Every day | ORAL | 2 refills | Status: DC
Start: 2023-06-08 — End: 2023-10-09

## 2023-06-08 MED ORDER — VERAPAMIL HCL 120 MG PO TABS: 240.0000 mg | ORAL_TABLET | Freq: Every day | ORAL | 2 refills | Status: DC

## 2023-06-08 MED ORDER — LEVOTHYROXINE SODIUM 25 MCG PO TABS
ORAL_TABLET | ORAL | 2 refills | Status: DC
Start: 2023-06-08 — End: 2023-10-09

## 2023-06-08 MED ORDER — LOSARTAN POTASSIUM-HCTZ 100-25 MG PO TABS: 1.0000 | ORAL_TABLET | Freq: Every day | ORAL | 2 refills | Status: DC

## 2023-06-08 MED ORDER — METFORMIN HCL 500 MG PO TABS: ORAL_TABLET | ORAL | 2 refills | Status: DC

## 2023-06-08 NOTE — Assessment & Plan Note (Signed)
 Chronic, stable  Continue farxiga  10mg  daily  Continue f/u as scheduled with nephrology

## 2023-06-08 NOTE — Assessment & Plan Note (Signed)
 Chronic Hypothyroidism is managed with Synthroid . Last TSH was checked two years ago. - Continue Synthroid  25 mcg daily - Order updated thyroid  studies

## 2023-06-08 NOTE — Assessment & Plan Note (Signed)
 Chronic Hypertension is well-controlled with blood pressure at 114/80 mmHg. - Continue losartan  100 mg daily - Continue hydrochlorothiazide  25 mg daily - Continue verapamil  240 mg daily

## 2023-06-08 NOTE — Progress Notes (Signed)
 Established patient visit   Patient: Francis Gomez   DOB: 1960/05/16   63 y.o. Male  MRN: [id number] Visit Date: 06/08/2023  Today's healthcare provider: Mimi Alt, MD   Chief Complaint  Patient presents with   Diabetes   Hypertension   Subjective       Discussed the use of AI scribe software for clinical note transcription with the patient, who gave verbal consent to proceed.  History of Present Illness Francis Gomez is a 63 year old male who presents for routine follow-up.  He has type two diabetes managed with Farxiga  10 mg daily and metformin  500 mg daily. His last A1c in April was 5.5, showing improvement from 6.6 in December 2024. No issues with diabetes management are reported.  He has stage three A chronic kidney disease. A renal function panel conducted on May 5th showed a creatinine level of 1.29 and an eGFR of 63. He recently had a kidney function test with his nephrologist, which showed normal results.  For hyperlipidemia, he continues atorvastatin  80 mg daily. His last lipid panel showed an LDL of 57, HDL of 36, and total cholesterol of 135, which are close to his goals.  His hypertension is managed with losartan  100 mg, hydrochlorothiazide  25 mg, and verapamil  240 mg daily. No current symptoms of hypertension are reported.  He has hypothyroidism and is on Synthroid  25 mcg daily. His last TSH was checked two years ago, and he reports difficulty obtaining levothyroxine  due to stock issues at the pharmacy.  He takes omeprazole  40 mg daily for reflux, which is well managed.  He mentions having received COVID and flu vaccinations, although there is some confusion about the reporting of these vaccinations in the system.  His last eye exam was a year ago and was normal. He plans to schedule an updated eye exam soon.     Past Medical History:  Diagnosis Date   Cervical paraspinous muscle spasm    Diabetes mellitus without complication  (HCC)    Hypertension    Hypothyroidism    Obesity    Osteoarthritis    left knee    Medications: Outpatient Medications Prior to Visit  Medication Sig   omeprazole  (PRILOSEC) 40 MG capsule TAKE 1 CAPSULE BY MOUTH AT BEDTIME.   [DISCONTINUED] atorvastatin  (LIPITOR) 80 MG tablet Take 1 tablet (80 mg total) by mouth daily.   [DISCONTINUED] dapagliflozin  propanediol (FARXIGA ) 10 MG TABS tablet Take 1 tablet (10 mg total) by mouth daily.   [DISCONTINUED] levothyroxine  (SYNTHROID ) 25 MCG tablet TAKE 1 TABLET (25 MCG TOTAL) BY MOUTH DAILY-- BEFORE BREAKFAST.   [DISCONTINUED] losartan -hydrochlorothiazide  (HYZAAR) 100-25 MG tablet Take 1 tablet by mouth daily.   [DISCONTINUED] metFORMIN  (GLUCOPHAGE ) 500 MG tablet TAKE 1 TABLET BY MOUTH EVERY DAY WITH BREAKFAST   [DISCONTINUED] verapamil  (CALAN ) 120 MG tablet Take 2 tablets (240 mg total) by mouth daily.   No facility-administered medications prior to visit.    Review of Systems  Last CBC Lab Results  Component Value Date   WBC 6.7 12/12/2022   HGB 15.8 12/12/2022   HCT 49.0 12/12/2022   MCV 85 12/12/2022   MCH 27.3 12/12/2022   RDW 14.0 12/12/2022   PLT 273 12/12/2022   Last metabolic panel Lab Results  Component Value Date   GLUCOSE 94 12/23/2022   NA 140 12/23/2022   K 3.9 12/23/2022   CL 100 12/23/2022   CO2 24 12/23/2022   BUN 19 12/23/2022   CREATININE 1.60 (H) 12/23/2022  EGFR 48 (L) 12/23/2022   CALCIUM  9.8 12/23/2022   PROT 7.6 12/12/2022   ALBUMIN 4.2 12/12/2022   LABGLOB 3.4 12/12/2022   AGRATIO 1.3 03/12/2022   BILITOT 0.5 12/12/2022   ALKPHOS 127 (H) 12/12/2022   AST 27 12/12/2022   ALT 30 12/12/2022   Last lipids Lab Results  Component Value Date   CHOL 135 12/12/2022   HDL 36 (L) 12/12/2022   LDLCALC 57 12/12/2022   TRIG 267 (H) 12/12/2022   CHOLHDL 3.8 12/12/2022   Last hemoglobin A1c Lab Results  Component Value Date   HGBA1C 6.6 (H) 12/12/2022   Last thyroid  functions Lab Results   Component Value Date   TSH 2.880 09/13/2021   T4TOTAL 7.0 02/14/2019        Objective    BP 114/80   Pulse 74   Ht 5\' 10"  (1.778 m)   Wt 204 lb (92.5 kg)   SpO2 99%   BMI 29.27 kg/m  BP Readings from Last 3 Encounters:  06/08/23 114/80  12/23/22 124/80  12/12/22 113/77   Wt Readings from Last 3 Encounters:  06/08/23 204 lb (92.5 kg)  12/23/22 211 lb 1.6 oz (95.8 kg)  12/12/22 215 lb 12.8 oz (97.9 kg)        Physical Exam Vitals reviewed.  Constitutional:      General: He is not in acute distress.    Appearance: Normal appearance. He is not ill-appearing.  Cardiovascular:     Rate and Rhythm: Normal rate and regular rhythm.  Pulmonary:     Effort: Pulmonary effort is normal. No respiratory distress.     Breath sounds: No wheezing, rhonchi or rales.  Neurological:     Mental Status: He is alert and oriented to person, place, and time.  Psychiatric:        Mood and Affect: Mood normal.        Behavior: Behavior normal.      No results found for any visits on 06/08/23.  Assessment & Plan     Problem List Items Addressed This Visit       Cardiovascular and Mediastinum   Hypertension associated with diabetes (HCC)   Chronic Hypertension is well-controlled with blood pressure at 114/80 mmHg. - Continue losartan  100 mg daily - Continue hydrochlorothiazide  25 mg daily - Continue verapamil  240 mg daily      Relevant Medications   atorvastatin  (LIPITOR) 80 MG tablet   dapagliflozin  propanediol (FARXIGA ) 10 MG TABS tablet   losartan -hydrochlorothiazide  (HYZAAR) 100-25 MG tablet   metFORMIN  (GLUCOPHAGE ) 500 MG tablet   verapamil  (CALAN ) 120 MG tablet     Digestive   Gastroesophageal reflux disease without esophagitis   Gastroesophageal reflux disease (GERD) GERD is managed with omeprazole . Chronic, symptoms well  controlled  - Continue omeprazole  40 mg daily at bedtime        Endocrine   Hypothyroidism   Chronic Hypothyroidism is managed with  Synthroid . Last TSH was checked two years ago. - Continue Synthroid  25 mcg daily - Order updated thyroid  studies      Relevant Medications   levothyroxine  (SYNTHROID ) 25 MCG tablet   Other Relevant Orders   TSH+T4F+T3Free   Hyperlipidemia associated with type 2 diabetes mellitus (HCC)   Chronic  Hyperlipidemia is managed with atorvastatin . Last lipid panel showed LDL at 57, HDL at 36, and total cholesterol at 135. - Continue atorvastatin  80 mg daily      Relevant Medications   atorvastatin  (LIPITOR) 80 MG tablet   dapagliflozin  propanediol (  FARXIGA ) 10 MG TABS tablet   losartan -hydrochlorothiazide  (HYZAAR) 100-25 MG tablet   metFORMIN  (GLUCOPHAGE ) 500 MG tablet   verapamil  (CALAN ) 120 MG tablet   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, without long-term current use of insulin (HCC) - Primary   Type 2 diabetes mellitus with CKD stage 3A Type 2 diabetes is well-controlled with an A1c of 5.5 as of April 20, 2023. CKD stage 3A is stable with creatinine at 1.29 and eGFR at 63. Chronic well controlled  - Continue Farxiga  10 mg daily - Continue metformin  500 mg daily      Relevant Medications   atorvastatin  (LIPITOR) 80 MG tablet   dapagliflozin  propanediol (FARXIGA ) 10 MG TABS tablet   losartan -hydrochlorothiazide  (HYZAAR) 100-25 MG tablet   metFORMIN  (GLUCOPHAGE ) 500 MG tablet     Genitourinary   Stage 3a chronic kidney disease (HCC)   Chronic, stable  Continue farxiga  10mg  daily  Continue f/u as scheduled with nephrology       Relevant Medications   dapagliflozin  propanediol (FARXIGA ) 10 MG TABS tablet   Other Visit Diagnoses       Prediabetes       Relevant Medications   metFORMIN  (GLUCOPHAGE ) 500 MG tablet     Diabetes mellitus without complication (HCC)       Relevant Medications   atorvastatin  (LIPITOR) 80 MG tablet   dapagliflozin  propanediol (FARXIGA ) 10 MG TABS tablet   losartan -hydrochlorothiazide  (HYZAAR) 100-25 MG  tablet   metFORMIN  (GLUCOPHAGE ) 500 MG tablet     Essential hypertension       Relevant Medications   atorvastatin  (LIPITOR) 80 MG tablet   losartan -hydrochlorothiazide  (HYZAAR) 100-25 MG tablet   verapamil  (CALAN ) 120 MG tablet       Assessment & Plan       General Health Maintenance He is up to date on COVID and flu vaccinations. Needs updated eye exam. - Schedule eye exam     Return in about 6 months (around 12/08/2023) for CPE.         Mimi Alt, MD  [facility] [phone] (phone) [phone] (fax)  [facility]

## 2023-06-08 NOTE — Assessment & Plan Note (Signed)
 Gastroesophageal reflux disease (GERD) GERD is managed with omeprazole . Chronic, symptoms well  controlled  - Continue omeprazole  40 mg daily at bedtime

## 2023-06-08 NOTE — Assessment & Plan Note (Signed)
 Chronic  Hyperlipidemia is managed with atorvastatin . Last lipid panel showed LDL at 57, HDL at 36, and total cholesterol at 135. - Continue atorvastatin  80 mg daily

## 2023-06-08 NOTE — Assessment & Plan Note (Signed)
 Type 2 diabetes mellitus with CKD stage 3A Type 2 diabetes is well-controlled with an A1c of 5.5 as of April 20, 2023. CKD stage 3A is stable with creatinine at 1.29 and eGFR at 63. Chronic well controlled  - Continue Farxiga  10 mg daily - Continue metformin  500 mg daily

## 2023-06-09 LAB — TSH+T4F+T3FREE
Free T4: 1.09 ng/dL (ref 0.82–1.77)
T3, Free: 3 pg/mL (ref 2.0–4.4)
TSH: 2.64 u[IU]/mL (ref 0.450–4.500)

## 2023-06-10 ENCOUNTER — Ambulatory Visit: Payer: Self-pay | Admitting: Family Medicine

## 2023-06-19 ENCOUNTER — Ambulatory Visit: Payer: 59 | Admitting: Family Medicine

## 2023-08-05 DIAGNOSIS — H2513 Age-related nuclear cataract, bilateral: Secondary | ICD-10-CM | POA: Diagnosis not present

## 2023-08-05 DIAGNOSIS — H524 Presbyopia: Secondary | ICD-10-CM | POA: Diagnosis not present

## 2023-08-10 ENCOUNTER — Telehealth: Payer: Self-pay | Admitting: Family Medicine

## 2023-08-10 ENCOUNTER — Other Ambulatory Visit: Payer: Self-pay

## 2023-08-10 DIAGNOSIS — K219 Gastro-esophageal reflux disease without esophagitis: Secondary | ICD-10-CM

## 2023-08-10 MED ORDER — OMEPRAZOLE 40 MG PO CPDR: 40.0000 mg | DELAYED_RELEASE_CAPSULE | Freq: Every day | ORAL | 3 refills | Status: DC

## 2023-08-10 NOTE — Telephone Encounter (Signed)
[facility] faxed refill request for the following medications: ? ?omeprazole (PRILOSEC) 40 MG capsule  ? ?Please advise. ? ? ? ?

## 2023-08-10 NOTE — Telephone Encounter (Signed)
 Rx sent.

## 2023-08-24 LAB — MICROALBUMIN / CREATININE URINE RATIO: Albumin/Creatinine Ratio, Urine, POC: <30

## 2023-09-21 DIAGNOSIS — I10 Essential (primary) hypertension: Secondary | ICD-10-CM | POA: Diagnosis not present

## 2023-09-21 DIAGNOSIS — E1122 Type 2 diabetes mellitus with diabetic chronic kidney disease: Secondary | ICD-10-CM | POA: Diagnosis not present

## 2023-09-21 DIAGNOSIS — N182 Chronic kidney disease, stage 2 (mild): Secondary | ICD-10-CM | POA: Diagnosis not present

## 2023-09-21 DIAGNOSIS — N2581 Secondary hyperparathyroidism of renal origin: Secondary | ICD-10-CM | POA: Diagnosis not present

## 2023-10-09 ENCOUNTER — Telehealth: Payer: Self-pay | Admitting: Family Medicine

## 2023-10-09 ENCOUNTER — Other Ambulatory Visit: Payer: Self-pay

## 2023-10-09 DIAGNOSIS — R7303 Prediabetes: Secondary | ICD-10-CM

## 2023-10-09 DIAGNOSIS — N1831 Chronic kidney disease, stage 3a: Secondary | ICD-10-CM

## 2023-10-09 DIAGNOSIS — E119 Type 2 diabetes mellitus without complications: Secondary | ICD-10-CM

## 2023-10-09 DIAGNOSIS — E039 Hypothyroidism, unspecified: Secondary | ICD-10-CM

## 2023-10-09 DIAGNOSIS — K219 Gastro-esophageal reflux disease without esophagitis: Secondary | ICD-10-CM

## 2023-10-09 DIAGNOSIS — E1169 Type 2 diabetes mellitus with other specified complication: Secondary | ICD-10-CM

## 2023-10-09 DIAGNOSIS — I10 Essential (primary) hypertension: Secondary | ICD-10-CM

## 2023-10-09 DIAGNOSIS — I152 Hypertension secondary to endocrine disorders: Secondary | ICD-10-CM

## 2023-10-09 MED ORDER — OMEPRAZOLE 40 MG PO CPDR: 40.0000 mg | DELAYED_RELEASE_CAPSULE | Freq: Every day | ORAL | 3 refills | Status: DC

## 2023-10-09 MED ORDER — VERAPAMIL HCL 120 MG PO TABS: 240.0000 mg | ORAL_TABLET | Freq: Every day | ORAL | 2 refills | Status: DC

## 2023-10-09 MED ORDER — LEVOTHYROXINE SODIUM 25 MCG PO TABS
ORAL_TABLET | ORAL | 2 refills | Status: DC
Start: 2023-10-09 — End: 2023-10-13

## 2023-10-09 MED ORDER — DAPAGLIFLOZIN PROPANEDIOL 10 MG PO TABS: 10.0000 mg | ORAL_TABLET | Freq: Every day | ORAL | 3 refills | Status: DC

## 2023-10-09 MED ORDER — ATORVASTATIN CALCIUM 80 MG PO TABS: 80.0000 mg | ORAL_TABLET | Freq: Every day | ORAL | 2 refills | Status: DC

## 2023-10-09 MED ORDER — LOSARTAN POTASSIUM-HCTZ 100-25 MG PO TABS: 1.0000 | ORAL_TABLET | Freq: Every day | ORAL | 2 refills | Status: DC

## 2023-10-09 MED ORDER — METFORMIN HCL 500 MG PO TABS: ORAL_TABLET | ORAL | 2 refills | Status: DC

## 2023-10-09 NOTE — Telephone Encounter (Signed)
 [facility] faxed refill request for the following medications:  atorvastatin  (LIPITOR) 80 MG tablet   dapagliflozin  propanediol (FARXIGA ) 10 MG TABS tablet   levothyroxine  (SYNTHROID ) 25 MCG tablet   losartan -hydrochlorothiazide  (HYZAAR) 100-25 MG tablet   metFORMIN  (GLUCOPHAGE ) 500 MG tablet   omeprazole  (PRILOSEC) 40 MG capsule   verapamil  (CALAN ) 120 MG tablet    Please advise.

## 2023-10-13 ENCOUNTER — Telehealth: Payer: Self-pay

## 2023-10-13 DIAGNOSIS — R7303 Prediabetes: Secondary | ICD-10-CM

## 2023-10-13 DIAGNOSIS — N1831 Chronic kidney disease, stage 3a: Secondary | ICD-10-CM

## 2023-10-13 DIAGNOSIS — E1159 Type 2 diabetes mellitus with other circulatory complications: Secondary | ICD-10-CM

## 2023-10-13 DIAGNOSIS — E039 Hypothyroidism, unspecified: Secondary | ICD-10-CM

## 2023-10-13 DIAGNOSIS — K219 Gastro-esophageal reflux disease without esophagitis: Secondary | ICD-10-CM

## 2023-10-13 DIAGNOSIS — I10 Essential (primary) hypertension: Secondary | ICD-10-CM

## 2023-10-13 DIAGNOSIS — E1169 Type 2 diabetes mellitus with other specified complication: Secondary | ICD-10-CM

## 2023-10-13 DIAGNOSIS — E119 Type 2 diabetes mellitus without complications: Secondary | ICD-10-CM

## 2023-10-13 MED ORDER — VERAPAMIL HCL 120 MG PO TABS: 240.0000 mg | ORAL_TABLET | Freq: Every day | ORAL | 2 refills | Status: AC

## 2023-10-13 MED ORDER — DAPAGLIFLOZIN PROPANEDIOL 10 MG PO TABS: 10.0000 mg | ORAL_TABLET | Freq: Every day | ORAL | 3 refills | Status: AC

## 2023-10-13 MED ORDER — OMEPRAZOLE 40 MG PO CPDR: 40.0000 mg | DELAYED_RELEASE_CAPSULE | Freq: Every day | ORAL | 3 refills | Status: AC

## 2023-10-13 MED ORDER — ATORVASTATIN CALCIUM 80 MG PO TABS: 80.0000 mg | ORAL_TABLET | Freq: Every day | ORAL | 2 refills | Status: AC

## 2023-10-13 MED ORDER — METFORMIN HCL 500 MG PO TABS: ORAL_TABLET | ORAL | 2 refills | Status: AC

## 2023-10-13 MED ORDER — LEVOTHYROXINE SODIUM 25 MCG PO TABS: ORAL_TABLET | ORAL | 2 refills | Status: AC

## 2023-10-13 MED ORDER — LOSARTAN POTASSIUM-HCTZ 100-25 MG PO TABS: 1.0000 | ORAL_TABLET | Freq: Every day | ORAL | 2 refills | Status: AC

## 2023-10-13 NOTE — Telephone Encounter (Signed)
 Copied from CRM [id number]. Topic: Clinical - Prescription Issue >> Oct 13, 2023 11:55 AM Ivette P wrote: Reason for CRM: [facility] in to notifyt pt would like prescriptions to be sent to them starting next refill.  For the following medication  atorvastatin  (LIPITOR) 80 MG tablet  losartan -hydrochlorothiazide  (HYZAAR) 100-25 MG tablet   metFORMIN  (GLUCOPHAGE ) 500 MG tablet  dapagliflozin  propanediol (FARXIGA ) 10 MG TABS tablet  verapamil  (CALAN ) 120 MG tablet  levothyroxine  (SYNTHROID ) 25 MCG tablet omeprazole  (PRILOSEC) 40 MG capsule      Callback [phone]  Fax [phone]

## 2023-10-23 ENCOUNTER — Telehealth: Payer: Self-pay

## 2023-10-23 NOTE — Telephone Encounter (Signed)
 Patient reports eye exam completed during home visit as well as he was seen at [facility] in July. I verbalized understanding. Asked for permission to send record release and patient provided verbal consent to request records.

## 2023-11-18 NOTE — Progress Notes (Signed)
 Francis Gomez                                          MRN: [id number]   11/18/2023   The [facility] Quality Team Specialist reviewed this patient medical record for the purposes of chart review for care gap closure. The following were reviewed: chart review for care gap closure-glycemic status assessment.    [facility] Quality Team

## 2023-12-09 ENCOUNTER — Ambulatory Visit: Admitting: Family Medicine

## 2023-12-09 ENCOUNTER — Encounter: Payer: Self-pay | Admitting: Family Medicine

## 2023-12-09 VITALS — BP 120/79 | HR 90 | Temp 98.6°F | Ht 70.0 in | Wt 208.8 lb

## 2023-12-09 DIAGNOSIS — E1159 Type 2 diabetes mellitus with other circulatory complications: Secondary | ICD-10-CM

## 2023-12-09 DIAGNOSIS — K219 Gastro-esophageal reflux disease without esophagitis: Secondary | ICD-10-CM

## 2023-12-09 DIAGNOSIS — Z0000 Encounter for general adult medical examination without abnormal findings: Secondary | ICD-10-CM

## 2023-12-09 DIAGNOSIS — E039 Hypothyroidism, unspecified: Secondary | ICD-10-CM

## 2023-12-09 DIAGNOSIS — N1831 Chronic kidney disease, stage 3a: Secondary | ICD-10-CM

## 2023-12-09 DIAGNOSIS — E1169 Type 2 diabetes mellitus with other specified complication: Secondary | ICD-10-CM

## 2023-12-09 NOTE — Progress Notes (Signed)
 Complete physical exam   Patient: Francis Gomez   DOB: Oct 14, 1960   63 y.o. Male  MRN: [id number] Visit Date: 12/09/2023  Today's healthcare provider: Rockie Agent, MD   Chief Complaint  Patient presents with   Annual Exam    Patient is present for annual exam with PCP.  Diet is normal per patient, exercising walking 3x daily.    Subjective    [name] is a 64 y.o. male who presents today for a complete physical exam.    He does not have additional problems to discuss today.   Discussed the use of AI scribe software for clinical note transcription with the patient, who gave verbal consent to proceed.  History of Present Illness Francis Gomez is a 63 year old male who presents for an annual physical exam.  He has a history of diabetes, hyperlipidemia, hypertension, and chronic kidney disease. No new symptoms or concerns are reported at this time.  His diabetes is well-controlled with a last A1c of 6.6. He maintains an active lifestyle by walking three times a day, covering four to five miles.  For hyperlipidemia, he is on atorvastatin  80 mg daily. He is due for a repeat lipid panel as his last test was a year ago.  His hypertension is managed with carvedilol 2 mg daily.  He has chronic kidney disease and sees a nephrologist every four months. His last visit was in August or September, during which blood work was performed. He cannot recall the name of his kidney doctor but mentions regular follow-ups.  He is also on synthroid  25 mcg daily for thyroid  management, with TSH levels within normal limits in June.  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 He does not currently work and spends his time walking and staying active. He did not attend a family Thanksgiving gathering due to personal preferences.  No issues with urination. No new symptoms or concerns reported.     Past Medical History:  Diagnosis Date   Cervical  paraspinous muscle spasm    Diabetes mellitus without complication (HCC)    Hypertension    Hypothyroidism    Obesity    Osteoarthritis    left knee   Past Surgical History:  Procedure Laterality Date   COLONOSCOPY WITH PROPOFOL  N/A 05/25/2020   Procedure: COLONOSCOPY WITH PROPOFOL ;  Surgeon: Janalyn Keene NOVAK, MD;  Location: [facility] ENDOSCOPY;  Service: Endoscopy;  Laterality: N/A;   COLONOSCOPY WITH PROPOFOL  N/A 06/24/2021   Procedure: COLONOSCOPY WITH PROPOFOL ;  Surgeon: Unk Corinn Skiff, MD;  Location: [facility] ENDOSCOPY;  Service: Gastroenterology;  Laterality: N/A;   KNEE SURGERY Left    x's 2   Social History   Socioeconomic History   Marital status: Single    Spouse name: Not on file   Number of children: Not on file   Years of education: Not on file   Highest education level: Not on file  Occupational History   Not on file  Tobacco Use   Smoking status: Never   Smokeless tobacco: Never  Vaping Use   Vaping status: Never Used  Substance and Sexual Activity   Alcohol use: No   Drug use: Never   Sexual activity: Yes  Other Topics Concern   Not on file  Social History Narrative   Not on file   Social Drivers of Health   Financial Resource Strain: Low Risk  (12/23/2022)   Overall Financial Resource Strain (CARDIA)    Difficulty of Paying Living  Expenses: Not hard at all  Food Insecurity: No Food Insecurity (12/23/2022)   Hunger Vital Sign    Worried About Running Out of Food in the Last Year: Never true    Ran Out of Food in the Last Year: Never true  Transportation Needs: No Transportation Needs (12/23/2022)   PRAPARE - Administrator, Civil Service (Medical): No    Lack of Transportation (Non-Medical): No  Physical Activity: Sufficiently Active (12/23/2022)   Exercise Vital Sign    Days of Exercise per Week: 7 days    Minutes of Exercise per Session: 40 min  Stress: No Stress Concern Present (12/23/2022)   [employer] of Occupational  Health - Occupational Stress Questionnaire    Feeling of Stress : Not at all  Social Connections: Moderately Isolated (12/23/2022)   Social Connection and Isolation Panel    Frequency of Communication with Friends and Family: Twice a week    Frequency of Social Gatherings with Friends and Family: Once a week    Attends Religious Services: More than 4 times per year    Active Member of [employer] or Organizations: No    Attends Banker Meetings: Never    Marital Status: Divorced  Catering Manager Violence: Not At Risk (12/23/2022)   Humiliation, Afraid, Rape, and Kick questionnaire    Fear of Current or Ex-Partner: No    Emotionally Abused: No    Physically Abused: No    Sexually Abused: No   Family Status  Relation Name Status   Mother  Alive   Father  Deceased   Sister  Alive   Brother  Alive   Sister  Alive   Sister  Alive   Brother  Alive  No partnership data on file   Family History  Problem Relation Age of Onset   Diabetes Mother    Heart disease Father 76       MI   Heart disease Sister    Allergies  Allergen Reactions   Amlodipine Itching     Medications: Outpatient Medications Prior to Visit  Medication Sig   atorvastatin  (LIPITOR) 80 MG tablet Take 1 tablet (80 mg total) by mouth daily.   dapagliflozin  propanediol (FARXIGA ) 10 MG TABS tablet Take 1 tablet (10 mg total) by mouth daily.   levothyroxine  (SYNTHROID ) 25 MCG tablet TAKE 1 TABLET (25 MCG TOTAL) BY MOUTH DAILY-- BEFORE BREAKFAST.   losartan -hydrochlorothiazide  (HYZAAR) 100-25 MG tablet Take 1 tablet by mouth daily.   metFORMIN  (GLUCOPHAGE ) 500 MG tablet TAKE 1 TABLET BY MOUTH EVERY DAY WITH BREAKFAST   omeprazole  (PRILOSEC) 40 MG capsule Take 1 capsule (40 mg total) by mouth at bedtime.   verapamil  (CALAN ) 120 MG tablet Take 2 tablets (240 mg total) by mouth daily.   No facility-administered medications prior to visit.    Review of Systems  Last CBC Lab Results  Component Value Date    WBC 6.7 12/12/2022   HGB 15.8 12/12/2022   HCT 49.0 12/12/2022   MCV 85 12/12/2022   MCH 27.3 12/12/2022   RDW 14.0 12/12/2022   PLT 273 12/12/2022   Last metabolic panel Lab Results  Component Value Date   GLUCOSE 94 12/23/2022   NA 140 12/23/2022   K 3.9 12/23/2022   CL 100 12/23/2022   CO2 24 12/23/2022   BUN 19 12/23/2022   CREATININE 1.60 (H) 12/23/2022   EGFR 48 (L) 12/23/2022   CALCIUM  9.8 12/23/2022   PROT 7.6 12/12/2022   ALBUMIN 4.2 12/12/2022  LABGLOB 3.4 12/12/2022   AGRATIO 1.3 03/12/2022   BILITOT 0.5 12/12/2022   ALKPHOS 127 (H) 12/12/2022   AST 27 12/12/2022   ALT 30 12/12/2022   Last lipids Lab Results  Component Value Date   CHOL 135 12/12/2022   HDL 36 (L) 12/12/2022   LDLCALC 57 12/12/2022   TRIG 267 (H) 12/12/2022   CHOLHDL 3.8 12/12/2022  The 10-year ASCVD risk score (Arnett DK, et al., 2019) is: 23.6%  Last hemoglobin A1c Lab Results  Component Value Date   HGBA1C 6.6 (H) 12/12/2022   Last thyroid  functions Lab Results  Component Value Date   TSH 2.640 06/08/2023   T4TOTAL 7.0 02/14/2019   FREET4 1.09 06/08/2023   Last vitamin D No results found for: 25OHVITD2, 25OHVITD3, VD25OH Last vitamin B12 and Folate No results found for: VITAMINB12, FOLATE     Objective    BP 120/79 (BP Location: Right Arm, Patient Position: Sitting, Cuff Size: Normal)   Pulse 90   Temp 98.6 F (37 C) (Oral)   Ht 5' 10 (1.778 m)   Wt 208 lb 12.8 oz (94.7 kg)   SpO2 96%   BMI 29.96 kg/m  BP Readings from Last 3 Encounters:  12/09/23 120/79  06/08/23 114/80  12/23/22 124/80   Wt Readings from Last 3 Encounters:  12/09/23 208 lb 12.8 oz (94.7 kg)  06/08/23 204 lb (92.5 kg)  12/23/22 211 lb 1.6 oz (95.8 kg)        Physical Exam Constitutional:      General: He is not in acute distress.    Appearance: Normal appearance. He is not ill-appearing.  HENT:     Mouth/Throat:     Mouth: Mucous membranes are moist.  Eyes:      General: No scleral icterus.       Right eye: No discharge.        Left eye: No discharge.     Extraocular Movements: Extraocular movements intact.     Conjunctiva/sclera: Conjunctivae normal.     Pupils: Pupils are equal, round, and reactive to light.  Cardiovascular:     Rate and Rhythm: Normal rate and regular rhythm.     Pulses:          Dorsalis pedis pulses are 2+ on the right side and 2+ on the left side.       Posterior tibial pulses are 2+ on the right side and 2+ on the left side.     Heart sounds: Normal heart sounds.  Pulmonary:     Effort: Pulmonary effort is normal.     Breath sounds: Normal breath sounds.  Abdominal:     General: Bowel sounds are normal. There is no distension.     Palpations: Abdomen is soft.     Tenderness: There is no abdominal tenderness.  Musculoskeletal:        General: No deformity or signs of injury. Normal range of motion.     Cervical back: Normal range of motion and neck supple. No tenderness.     Right lower leg: No edema.     Left lower leg: No edema.     Right foot: Normal range of motion. No deformity or bunion.     Left foot: Normal range of motion. No deformity or bunion.  Feet:     Right foot:     Protective Sensation: 6 sites tested.  6 sites sensed.     Skin integrity: Skin integrity normal. No erythema.     Toenail  Condition: Right toenails are normal.     Left foot:     Protective Sensation: 6 sites tested.  6 sites sensed.     Skin integrity: Skin integrity normal. No erythema.     Toenail Condition: Left toenails are normal.  Lymphadenopathy:     Cervical: No cervical adenopathy.  Neurological:     Mental Status: He is alert and oriented to person, place, and time.     Cranial Nerves: No cranial nerve deficit.     Motor: No weakness.     Gait: Gait normal.  Psychiatric:        Mood and Affect: Mood normal.        Behavior: Behavior normal.       Last depression screening scores    12/09/2023    8:46 AM 06/08/2023     8:24 AM 12/23/2022    9:32 AM  PHQ 2/9 Scores  PHQ - 2 Score 0 0 0  PHQ- 9 Score 0 0  0      Data saved with a previous flowsheet row definition    Last fall risk screening    12/09/2023    8:45 AM  Fall Risk   Falls in the past year? 0  Number falls in past yr: 0  Injury with Fall? 0  Risk for fall due to : No Fall Risks  Follow up Falls evaluation completed    Last Audit-C alcohol use screening    12/23/2022    9:31 AM  Alcohol Use Disorder Test (AUDIT)  1. How often do you have a drink containing alcohol? 0  2. How many drinks containing alcohol do you have on a typical day when you are drinking? 0  3. How often do you have six or more drinks on one occasion? 0  AUDIT-C Score 0   A score of 3 or more in women, and 4 or more in men indicates increased risk for alcohol abuse, EXCEPT if all of the points are from question 1   No results found for any visits on 12/09/23.  Assessment & Plan    Routine Health Maintenance and Physical Exam  Immunization History  Administered Date(s) Administered    sv, Bivalent, Protein Subunit Rsvpref,pf [place]) 12/05/2021   Fluzone Influenza virus vaccine,trivalent (IIV3), split virus 11/10/2008   INFLUENZA, HIGH DOSE SEASONAL PF 09/05/2023   Influenza, Seasonal, Injecte, Preservative Fre 09/09/2022   Influenza,inj,Quad PF,6+ Mos 03/26/2018, 02/11/2019, 09/01/2019, 03/13/2021, 09/13/2021   Influenza-Unspecified 10/06/2016   MODERNA COVID-19 SARS-COV-2 PEDS BIVALENT BOOSTER 31yr-16yr 03/31/2019, 04/27/2019, 12/27/2019   Moderna Covid-19 Fall Seasonal Vaccine 52yrs & older 11/04/2021, 10/24/2022   Moderna Sars-Covid-2 Vaccination 03/31/2019, 04/27/2019, 12/27/2019   PNEUMOCOCCAL CONJUGATE-20 11/14/2020   Pfizer(Comirnaty)Fall Seasonal Vaccine 12 years and older 11/04/2021   Respiratory Syncytial Virus Vaccine,Recomb Aduvanted(Arexvy) 12/05/2021   Tdap 12/29/2006, 03/26/2018   Zoster Recombinant(Shingrix ) 11/04/2021, 03/12/2022     Health Maintenance  Topic Date Due   HEMOGLOBIN A1C  10/20/2023   Diabetic kidney evaluation - eGFR measurement  12/23/2023   Medicare Annual Wellness (AWV)  12/23/2023   COVID-19 Vaccine (9 - 2025-26 season) 12/25/2023 (Originally 09/07/2023)   OPHTHALMOLOGY EXAM  08/04/2024   Diabetic kidney evaluation - Urine ACR  08/23/2024   FOOT EXAM  12/08/2024   DTaP/Tdap/Td (3 - Td or Tdap) 03/25/2028   Colonoscopy  06/25/2031   Pneumococcal Vaccine: 50+ Years  Completed   Influenza Vaccine  Completed   Hepatitis C Screening  Completed   HIV Screening  Completed  Zoster Vaccines- Shingrix   Completed   Hepatitis B Vaccines 19-59 Average Risk  Aged Out   HPV VACCINES  Aged Out   Meningococcal B Vaccine  Aged Out    Problem List Items Addressed This Visit     Annual physical exam - Primary   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, without long-term current use of insulin (HCC)   Relevant Orders   HgB A1c   Gastroesophageal reflux disease without esophagitis   Hyperlipidemia associated with type 2 diabetes mellitus (HCC)   Relevant Orders   Lipid panel   Hypertension associated with diabetes (HCC)   Hypothyroidism   Relevant Orders   TSH + free T4   Stage 3a chronic kidney disease (HCC)    Assessment and Plan Assessment & Plan Type 2 diabetes mellitus with stage 3a chronic kidney disease and hyperlipidemia Type 2 diabetes mellitus is well-controlled with an A1c of 6.6. Stage 3a chronic kidney disease is managed with regular follow-ups with a nephrologist. Hyperlipidemia management requires updated lipid panel to ensure cholesterol levels are at goal. - Ordered A1c test - Ordered lipid panel - Continue current diabetes management regimen  Hypertension associated with diabetes - Continue current antihypertensive medication regimen  Stage 3a chronic kidney disease Managed with regular follow-ups with a nephrologist. Recent labs from  September show creatinine at 1.48 and GFR at 53, indicating stable kidney function. - Continue regular follow-ups with nephrologist every four months  Acquired hypothyroidism TSH levels were within normal limits in June. - Continue current thyroid  medication regimen  General Health Maintenance Up to date on all health maintenance, including vaccinations and screenings. Medicare wellness visit is scheduled for December 23rd. - Continue routine health maintenance and screenings - Attend scheduled Medicare wellness visit on December 23rd       Return in about 6 months (around 06/08/2024) for Chronic F/U, DM, HTN,CKD.       Rockie Agent, MD  [facility] [phone] (phone) [phone] (fax)  [facility]

## 2023-12-09 NOTE — Patient Instructions (Addendum)
 To keep you healthy, please keep in mind the following health maintenance items that you are due for:   Health Maintenance Due  Topic Date Due   HEMOGLOBIN A1C  10/20/2023   Diabetic kidney evaluation - eGFR measurement  12/23/2023   Medicare Annual Wellness (AWV)  12/23/2023     Best Wishes,   Dr. Lang

## 2023-12-10 LAB — LIPID PANEL
Chol/HDL Ratio: 4.2 ratio (ref 0.0–5.0)
Cholesterol, Total: 137 mg/dL (ref 100–199)
HDL: 33 mg/dL — ABNORMAL LOW (ref >39)
LDL Chol Calc (NIH): 41 mg/dL (ref 0–99)
Triglycerides: 435 mg/dL — ABNORMAL HIGH (ref 0–149)
VLDL Cholesterol Cal: 63 mg/dL — ABNORMAL HIGH (ref 5–40)

## 2023-12-10 LAB — TSH+FREE T4
Free T4: 1.03 ng/dL (ref 0.82–1.77)
TSH: 3.51 u[IU]/mL (ref 0.450–4.500)

## 2023-12-10 LAB — HEMOGLOBIN A1C
Est. average glucose Bld gHb Est-mCnc: 131 mg/dL
Hgb A1c MFr Bld: 6.2 % — ABNORMAL HIGH (ref 4.8–5.6)

## 2023-12-21 ENCOUNTER — Ambulatory Visit: Payer: Self-pay | Admitting: Family Medicine

## 2023-12-21 DIAGNOSIS — E781 Pure hyperglyceridemia: Secondary | ICD-10-CM

## 2023-12-21 MED ORDER — FENOFIBRATE 48 MG PO TABS: 48.0000 mg | ORAL_TABLET | Freq: Every day | ORAL | 2 refills | Status: AC

## 2023-12-21 MED ORDER — FENOFIBRATE 48 MG PO TABS: 48.0000 mg | ORAL_TABLET | Freq: Every day | ORAL | 2 refills | Status: DC

## 2023-12-21 NOTE — Telephone Encounter (Signed)
-----   Message from Rockie Agent, MD sent at 12/21/2023  8:07 AM EST ----- A1c indicates well controlled diabetes, within goal range of less than 7   Triglycerides are moderately elevated above goal of 150. Low HDL. Recommend starting fenofibrate  to help lower the triglycerides   Thyroid  studies were within normal limits.

## 2023-12-29 ENCOUNTER — Ambulatory Visit: Payer: Self-pay

## 2023-12-29 VITALS — BP 134/72 | Ht 70.0 in | Wt 208.2 lb

## 2023-12-29 DIAGNOSIS — Z0000 Encounter for general adult medical examination without abnormal findings: Secondary | ICD-10-CM | POA: Diagnosis not present

## 2023-12-29 NOTE — Progress Notes (Signed)
 "  No chief complaint on file.    Subjective:   Francis Gomez is a 63 y.o. male who presents for a Medicare Annual Wellness Visit.  Fall Screening Falls in the past year?: 0 Number of falls in past year: 0 Was there an injury with Fall?: 0 Fall Risk Category Calculator: 0 Patient Fall Risk Level: Low Fall Risk  Fall Risk Patient at Risk for Falls Due to: No Fall Risks Fall risk Follow up: Falls evaluation completed    Allergies (verified) Amlodipine   Current Medications (verified) Outpatient Encounter Medications as of 12/29/2023  Medication Sig   atorvastatin  (LIPITOR) 80 MG tablet Take 1 tablet (80 mg total) by mouth daily.   dapagliflozin  propanediol (FARXIGA ) 10 MG TABS tablet Take 1 tablet (10 mg total) by mouth daily.   fenofibrate  (TRICOR ) 48 MG tablet Take 1 tablet (48 mg total) by mouth daily.   levothyroxine  (SYNTHROID ) 25 MCG tablet TAKE 1 TABLET (25 MCG TOTAL) BY MOUTH DAILY-- BEFORE BREAKFAST.   losartan -hydrochlorothiazide  (HYZAAR) 100-25 MG tablet Take 1 tablet by mouth daily.   metFORMIN  (GLUCOPHAGE ) 500 MG tablet TAKE 1 TABLET BY MOUTH EVERY DAY WITH BREAKFAST   omeprazole  (PRILOSEC) 40 MG capsule Take 1 capsule (40 mg total) by mouth at bedtime.   verapamil  (CALAN ) 120 MG tablet Take 2 tablets (240 mg total) by mouth daily.   No facility-administered encounter medications on file as of 12/29/2023.    History: Past Medical History:  Diagnosis Date   Cervical paraspinous muscle spasm    Diabetes mellitus without complication (HCC)    Hypertension    Hypothyroidism    Obesity    Osteoarthritis    left knee   Past Surgical History:  Procedure Laterality Date   COLONOSCOPY WITH PROPOFOL  N/A 05/25/2020   Procedure: COLONOSCOPY WITH PROPOFOL ;  Surgeon: Janalyn Keene NOVAK, MD;  Location: [facility] ENDOSCOPY;  Service: Endoscopy;  Laterality: N/A;   COLONOSCOPY WITH PROPOFOL  N/A 06/24/2021   Procedure: COLONOSCOPY WITH PROPOFOL ;  Surgeon: Unk Corinn Skiff, MD;  Location: [facility] ENDOSCOPY;  Service: Gastroenterology;  Laterality: N/A;   KNEE SURGERY Left    x's 2   Family History  Problem Relation Age of Onset   Diabetes Mother    Heart disease Father 61       MI   Heart disease Sister    Social History   Occupational History   Not on file  Tobacco Use   Smoking status: Never   Smokeless tobacco: Never  Vaping Use   Vaping status: Never Used  Substance and Sexual Activity   Alcohol use: No   Drug use: Never   Sexual activity: Yes   Tobacco Counseling Counseling given: Not Answered  SDOH Screenings   Food Insecurity: No Food Insecurity (12/23/2022)  Housing: Unknown (12/23/2022)  Transportation Needs: No Transportation Needs (12/23/2022)  Utilities: Not At Risk (12/23/2022)  Alcohol Screen: Low Risk (12/23/2022)  Depression (PHQ2-9): Low Risk (12/09/2023)  Financial Resource Strain: Low Risk (12/23/2022)  Physical Activity: Sufficiently Active (12/23/2022)  Social Connections: Moderately Isolated (12/23/2022)  Stress: No Stress Concern Present (12/23/2022)  Tobacco Use: Low Risk (12/09/2023)  Health Literacy: Adequate Health Literacy (12/23/2022)   See flowsheets for full screening details  Depression Screen PHQ 2 & 9 Depression Scale- Over the past 2 weeks, how often have you been bothered by any of the following problems? Little interest or pleasure in doing things: 0 Feeling down, depressed, or hopeless (PHQ Adolescent also includes...irritable): 0 PHQ-2 Total Score: 0 Trouble  falling or staying asleep, or sleeping too much: 0 Feeling tired or having little energy: 0 Poor appetite or overeating (PHQ Adolescent also includes...weight loss): 0 Feeling bad about yourself - or that you are a failure or have let yourself or your family down: 0 Trouble concentrating on things, such as reading the newspaper or watching television (PHQ Adolescent also includes...like school work): 0 Moving or speaking so slowly that  other people could have noticed. Or the opposite - being so fidgety or restless that you have been moving around a lot more than usual: 0 Thoughts that you would be better off dead, or of hurting yourself in some way: 0 PHQ-9 Total Score: 0 If you checked off any problems, how difficult have these problems made it for you to do your work, take care of things at home, or get along with other people?: Not difficult at all     Goals Addressed   None          Objective:    There were no vitals filed for this visit. There is no height or weight on file to calculate BMI.  Hearing/Vision screen No results found. Immunizations and Health Maintenance Health Maintenance  Topic Date Due   COVID-19 Vaccine (8 - 2025-26 season) 09/07/2023   Diabetic kidney evaluation - eGFR measurement  12/23/2023   Medicare Annual Wellness (AWV)  12/23/2023   HEMOGLOBIN A1C  06/08/2024   OPHTHALMOLOGY EXAM  08/04/2024   Diabetic kidney evaluation - Urine ACR  08/23/2024   FOOT EXAM  12/08/2024   DTaP/Tdap/Td (3 - Td or Tdap) 03/25/2028   Colonoscopy  06/25/2031   Pneumococcal Vaccine: 50+ Years  Completed   Influenza Vaccine  Completed   Hepatitis C Screening  Completed   HIV Screening  Completed   Zoster Vaccines- Shingrix   Completed   Hepatitis B Vaccines 19-59 Average Risk  Aged Out   HPV VACCINES  Aged Out   Meningococcal B Vaccine  Aged Out        Assessment/Plan:  This is a routine wellness examination for Francis Gomez.  Patient Care Team: Sharma Coyer, MD as PCP - General (Family Medicine) [place], [place] [facility]  I have personally reviewed and noted the following in the patients chart:   Medical and social history Use of alcohol, tobacco or illicit drugs  Current medications and supplements including opioid prescriptions. Functional ability and status Nutritional status Physical activity Advanced directives List of other physicians Hospitalizations, surgeries, and  ER visits in previous 12 months Vitals Screenings to include cognitive, depression, and falls Referrals and appointments  No orders of the defined types were placed in this encounter.  In addition, I have reviewed and discussed with patient certain preventive protocols, quality metrics, and best practice recommendations. A written personalized care plan for preventive services as well as general preventive health recommendations were provided to patient.   Jhonnie GORMAN Das, LPN   [id number]   No follow-ups on file.  After Visit Summary: (In Person-Printed) AVS printed and given to the patient  Nurse Notes: UTD ON SHOTS; UTD ON COLONOSCOPY "

## 2023-12-29 NOTE — Patient Instructions (Addendum)
 Mr. Tews,  Thank you for taking the time for your Medicare Wellness Visit. I appreciate your continued commitment to your health goals. Please review the care plan we discussed, and feel free to reach out if I can assist you further.  Please note that Annual Wellness Visits do not include a physical exam. Some assessments may be limited, especially if the visit was conducted virtually. If needed, we may recommend an in-person follow-up with your provider.  Ongoing Care Seeing your primary care provider every 3 to 6 months helps us  monitor your health and provide consistent, personalized care.   Referrals If a referral was made during today's visit and you haven't received any updates within two weeks, please contact the referred provider directly to check on the status.  Recommended Screenings:  Health Maintenance  Topic Date Due   COVID-19 Vaccine (8 - 2025-26 season) 09/07/2023   Yearly kidney function blood test for diabetes  12/23/2023   Hemoglobin A1C  06/08/2024   Eye exam for diabetics  08/04/2024   Yearly kidney health urinalysis for diabetes  08/23/2024   Complete foot exam   12/08/2024   Medicare Annual Wellness Visit  12/28/2024   DTaP/Tdap/Td vaccine (3 - Td or Tdap) 03/25/2028   Colon Cancer Screening  06/25/2031   Pneumococcal Vaccine for age over 79  Completed   Flu Shot  Completed   Hepatitis C Screening  Completed   HIV Screening  Completed   Zoster (Shingles) Vaccine  Completed   Hepatitis B Vaccine  Aged Out   HPV Vaccine  Aged Out   Meningitis B Vaccine  Aged Out     Vision: Annual vision screenings are recommended for early detection of glaucoma, cataracts, and diabetic retinopathy. These exams can also reveal signs of chronic conditions such as diabetes and high blood pressure.  Dental: Annual dental screenings help detect early signs of oral cancer, gum disease, and other conditions linked to overall health, including heart disease and  diabetes.  Please see the attached documents for additional preventive care recommendations.    NEXT AWV 01/03/25 @ 10:10 AM IN PERSON

## 2024-01-25 NOTE — Progress Notes (Signed)
 Francis Gomez                                          MRN: [id number]   01/25/2024   The [facility] Quality Team Specialist reviewed this patient medical record for the purposes of chart review for care gap closure. The following were reviewed: abstraction for care gap closure-glycemic status assessment.    [facility]BCI Quality Team

## 2024-06-08 ENCOUNTER — Ambulatory Visit: Admitting: Family Medicine

## 2025-01-03 ENCOUNTER — Ambulatory Visit
